# Patient Record
Sex: Female | Born: 1980
Health system: Southern US, Community
[De-identification: ages and names within clinical notes are randomized; demographics above are authoritative.]

## PROBLEM LIST (undated history)

## (undated) ENCOUNTER — Inpatient Hospital Stay (HOSPITAL_COMMUNITY): Payer: Self-pay

## (undated) DIAGNOSIS — F32A Depression, unspecified: Secondary | ICD-10-CM

## (undated) DIAGNOSIS — B999 Unspecified infectious disease: Secondary | ICD-10-CM

## (undated) DIAGNOSIS — R51 Headache: Secondary | ICD-10-CM

## (undated) DIAGNOSIS — B009 Herpesviral infection, unspecified: Secondary | ICD-10-CM

## (undated) DIAGNOSIS — F419 Anxiety disorder, unspecified: Secondary | ICD-10-CM

## (undated) DIAGNOSIS — S41119A Laceration without foreign body of unspecified upper arm, initial encounter: Secondary | ICD-10-CM

## (undated) DIAGNOSIS — IMO0002 Reserved for concepts with insufficient information to code with codable children: Secondary | ICD-10-CM

## (undated) DIAGNOSIS — N879 Dysplasia of cervix uteri, unspecified: Secondary | ICD-10-CM

## (undated) HISTORY — DX: Dysplasia of cervix uteri, unspecified: N87.9

## (undated) HISTORY — DX: Headache: R51

## (undated) HISTORY — PX: TENDON REPAIR: SHX5111

## (undated) HISTORY — DX: Unspecified infectious disease: B99.9

## (undated) HISTORY — DX: Laceration without foreign body of unspecified upper arm, initial encounter: S41.119A

## (undated) HISTORY — DX: Anxiety disorder, unspecified: F41.9

## (undated) HISTORY — DX: Reserved for concepts with insufficient information to code with codable children: IMO0002

## (undated) HISTORY — DX: Herpesviral infection, unspecified: B00.9

## (undated) HISTORY — PX: TONSILLECTOMY: SUR1361

---
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
## 2000-09-23 ENCOUNTER — Emergency Department (HOSPITAL_COMMUNITY): Admission: EM | Admit: 2000-09-23 | Discharge: 2000-09-23 | Payer: Self-pay | Admitting: *Deleted

## 2000-09-27 ENCOUNTER — Emergency Department (HOSPITAL_COMMUNITY): Admission: EM | Admit: 2000-09-27 | Discharge: 2000-09-27 | Payer: Self-pay | Admitting: Emergency Medicine

## 2001-01-04 ENCOUNTER — Emergency Department (HOSPITAL_COMMUNITY): Admission: EM | Admit: 2001-01-04 | Discharge: 2001-01-04 | Payer: Self-pay

## 2001-08-08 ENCOUNTER — Emergency Department (HOSPITAL_COMMUNITY): Admission: EM | Admit: 2001-08-08 | Discharge: 2001-08-08 | Payer: Self-pay | Admitting: Emergency Medicine

## 2001-09-02 ENCOUNTER — Observation Stay (HOSPITAL_COMMUNITY): Admission: EM | Admit: 2001-09-02 | Discharge: 2001-09-02 | Payer: Self-pay | Admitting: Emergency Medicine

## 2001-09-07 ENCOUNTER — Emergency Department (HOSPITAL_COMMUNITY): Admission: EM | Admit: 2001-09-07 | Discharge: 2001-09-08 | Payer: Self-pay | Admitting: Emergency Medicine

## 2002-03-24 DIAGNOSIS — S41119A Laceration without foreign body of unspecified upper arm, initial encounter: Secondary | ICD-10-CM

## 2002-03-24 DIAGNOSIS — B999 Unspecified infectious disease: Secondary | ICD-10-CM

## 2002-03-24 HISTORY — DX: Unspecified infectious disease: B99.9

## 2002-03-24 HISTORY — DX: Laceration without foreign body of unspecified upper arm, initial encounter: S41.119A

## 2002-03-24 HISTORY — PX: ARTERY REPAIR: SHX559

## 2002-12-16 ENCOUNTER — Emergency Department (HOSPITAL_COMMUNITY): Admission: EM | Admit: 2002-12-16 | Discharge: 2002-12-16 | Payer: Self-pay | Admitting: Emergency Medicine

## 2003-07-20 ENCOUNTER — Emergency Department (HOSPITAL_COMMUNITY): Admission: EM | Admit: 2003-07-20 | Discharge: 2003-07-20 | Payer: Self-pay | Admitting: Emergency Medicine

## 2003-07-27 ENCOUNTER — Emergency Department (HOSPITAL_COMMUNITY): Admission: EM | Admit: 2003-07-27 | Discharge: 2003-07-27 | Payer: Self-pay | Admitting: Family Medicine

## 2003-08-30 ENCOUNTER — Encounter: Admission: RE | Admit: 2003-08-30 | Discharge: 2003-08-30 | Payer: Self-pay | Admitting: Internal Medicine

## 2003-08-30 ENCOUNTER — Other Ambulatory Visit: Admission: RE | Admit: 2003-08-30 | Discharge: 2003-08-30 | Payer: Self-pay | Admitting: Internal Medicine

## 2004-09-07 ENCOUNTER — Emergency Department (HOSPITAL_COMMUNITY): Admission: EM | Admit: 2004-09-07 | Discharge: 2004-09-07 | Payer: Self-pay | Admitting: Emergency Medicine

## 2004-12-16 ENCOUNTER — Emergency Department (HOSPITAL_COMMUNITY): Admission: EM | Admit: 2004-12-16 | Discharge: 2004-12-16 | Payer: Self-pay | Admitting: Family Medicine

## 2005-01-30 ENCOUNTER — Other Ambulatory Visit: Admission: RE | Admit: 2005-01-30 | Discharge: 2005-01-30 | Payer: Self-pay | Admitting: Gynecology

## 2005-07-20 ENCOUNTER — Emergency Department (HOSPITAL_COMMUNITY): Admission: EM | Admit: 2005-07-20 | Discharge: 2005-07-20 | Payer: Self-pay | Admitting: Emergency Medicine

## 2005-07-27 ENCOUNTER — Inpatient Hospital Stay (HOSPITAL_COMMUNITY): Admission: AD | Admit: 2005-07-27 | Discharge: 2005-07-27 | Payer: Self-pay | Admitting: Gynecology

## 2005-08-26 ENCOUNTER — Other Ambulatory Visit: Admission: RE | Admit: 2005-08-26 | Discharge: 2005-08-26 | Payer: Self-pay | Admitting: Gynecology

## 2005-10-09 ENCOUNTER — Inpatient Hospital Stay (HOSPITAL_COMMUNITY): Admission: AD | Admit: 2005-10-09 | Discharge: 2005-10-09 | Payer: Self-pay | Admitting: Gynecology

## 2006-01-26 ENCOUNTER — Inpatient Hospital Stay: Payer: Self-pay

## 2006-02-01 ENCOUNTER — Inpatient Hospital Stay (HOSPITAL_COMMUNITY): Admission: AD | Admit: 2006-02-01 | Discharge: 2006-02-02 | Payer: Self-pay | Admitting: Gynecology

## 2006-03-10 ENCOUNTER — Inpatient Hospital Stay (HOSPITAL_COMMUNITY): Admission: AD | Admit: 2006-03-10 | Discharge: 2006-03-14 | Payer: Self-pay | Admitting: Gynecology

## 2006-03-11 ENCOUNTER — Encounter (INDEPENDENT_AMBULATORY_CARE_PROVIDER_SITE_OTHER): Payer: Self-pay | Admitting: *Deleted

## 2006-04-22 ENCOUNTER — Other Ambulatory Visit: Admission: RE | Admit: 2006-04-22 | Discharge: 2006-04-22 | Payer: Self-pay | Admitting: Gynecology

## 2007-03-25 DIAGNOSIS — F419 Anxiety disorder, unspecified: Secondary | ICD-10-CM

## 2007-03-25 HISTORY — DX: Anxiety disorder, unspecified: F41.9

## 2007-10-22 ENCOUNTER — Other Ambulatory Visit: Admission: RE | Admit: 2007-10-22 | Discharge: 2007-10-22 | Payer: Self-pay | Admitting: Gynecology

## 2008-03-24 HISTORY — PX: CERVICAL BIOPSY  W/ LOOP ELECTRODE EXCISION: SUR135

## 2008-11-03 ENCOUNTER — Other Ambulatory Visit: Admission: RE | Admit: 2008-11-03 | Discharge: 2008-11-03 | Payer: Self-pay | Admitting: Gynecology

## 2008-11-03 ENCOUNTER — Encounter: Payer: Self-pay | Admitting: Gynecology

## 2008-11-03 ENCOUNTER — Ambulatory Visit: Payer: Self-pay | Admitting: Gynecology

## 2008-12-01 ENCOUNTER — Encounter: Payer: Self-pay | Admitting: Gynecology

## 2008-12-01 ENCOUNTER — Ambulatory Visit: Payer: Self-pay | Admitting: Gynecology

## 2008-12-15 ENCOUNTER — Ambulatory Visit: Payer: Self-pay | Admitting: Gynecology

## 2009-01-08 ENCOUNTER — Ambulatory Visit: Payer: Self-pay | Admitting: Gynecology

## 2009-01-31 ENCOUNTER — Ambulatory Visit: Payer: Self-pay | Admitting: Gynecology

## 2009-03-19 ENCOUNTER — Ambulatory Visit: Payer: Self-pay | Admitting: Gynecology

## 2009-03-21 ENCOUNTER — Ambulatory Visit: Payer: Self-pay | Admitting: Gynecology

## 2009-03-24 HISTORY — PX: WISDOM TOOTH EXTRACTION: SHX21

## 2009-05-17 ENCOUNTER — Ambulatory Visit: Payer: Self-pay | Admitting: Gynecology

## 2009-06-12 ENCOUNTER — Other Ambulatory Visit: Admission: RE | Admit: 2009-06-12 | Discharge: 2009-06-12 | Payer: Self-pay | Admitting: Gynecology

## 2009-06-12 ENCOUNTER — Ambulatory Visit: Payer: Self-pay | Admitting: Gynecology

## 2009-12-14 ENCOUNTER — Ambulatory Visit: Payer: Self-pay | Admitting: Gynecology

## 2010-03-24 DIAGNOSIS — R87619 Unspecified abnormal cytological findings in specimens from cervix uteri: Secondary | ICD-10-CM

## 2010-03-24 DIAGNOSIS — IMO0002 Reserved for concepts with insufficient information to code with codable children: Secondary | ICD-10-CM

## 2010-03-24 HISTORY — DX: Reserved for concepts with insufficient information to code with codable children: IMO0002

## 2010-03-24 HISTORY — DX: Unspecified abnormal cytological findings in specimens from cervix uteri: R87.619

## 2010-03-29 ENCOUNTER — Ambulatory Visit
Admission: RE | Admit: 2010-03-29 | Discharge: 2010-03-29 | Payer: Self-pay | Source: Home / Self Care | Attending: Gynecology | Admitting: Gynecology

## 2010-04-14 ENCOUNTER — Encounter: Payer: Self-pay | Admitting: Internal Medicine

## 2010-08-09 NOTE — Discharge Summary (Signed)
Karen Davidson, YERBY                ACCOUNT NO.:  [id number]   MEDICAL RECORD NO.:  [id number]          PATIENT TYPE:  INP   LOCATION:  9117                          FACILITY:  [facility]   PHYSICIAN:  Juan H. Lily Peer, M.D.DATE OF BIRTH:  04/23/1980   DATE OF ADMISSION:  03/10/2006  DATE OF DISCHARGE:  03/14/2006                               DISCHARGE SUMMARY   HISTORY:  The patient is a 30 year old gravida 1, now para 1 who  underwent a primary lower uterine segment transverse cesarean section on  March 11, 2006 by Dr. Nadyne Coombes. Fontaine as a result of  cephalopelvic disproportion and clinical chorioamnionitis. The patient  had donated her cord blood for public cord blood banking. She delivered  a viable female infant with Apgars of 8 and 9 with a weight of 6 pounds 13  ounces, normal pelvic anatomy. On postoperative day #1, her Foley  catheter was removed. She was advanced from a clear to a regular diet.  Her hemoglobin and hematocrit were 8.9 and 26.2, respectively, with a  platelet count of 206,000. The patient's diet was gradually advanced.  She was began ambulating and was ready to be discharged home on her  third postoperative day. Her incision site was intact, and vital signs  were stable, and she was afebrile.   FINAL DIAGNOSES:  1. Term intrauterine pregnancy (delivered).  2. Cephalopelvic disproportion.  3. Clinical chorioamnionitis.   PROCEDURE PERFORMED:  Primary lower uterine segment transverse cesarean  section.   FINAL DISPOSITION AND FOLLOWUP:  The patient was discharged home on her  third postoperative day. She was up and ambulating and tolerating a  regular diet well. She was discharged home with prenatal vitamins and  iron supplementation 1 p.o. of each daily. She was also given  prescription for Motrin 800 mg to take t.i.d. p.r.n. along with a  prescription for Tylox to take 1 p.o. q.4-6h. p.r.n. Discharge  instructions were provided, and she was instructed to  follow up in the  office in 6 weeks for a postpartum visit.      Juan H. Lily Peer, M.D.  Electronically Signed     JHF/MEDQ  D:  03/14/2006  T:  03/14/2006  Job:  [id number]

## 2010-08-09 NOTE — H&P (Signed)
Karen Davidson, Karen Davidson                ACCOUNT NO.:  [id number]   MEDICAL RECORD NO.:  [id number]          PATIENT TYPE:  INP   LOCATION:  9156                          [facility]  [facility]   PHYSICIAN:  Juan H. Lily Peer, M.D.DATE OF BIRTH:  23-Oct-1980   DATE OF ADMISSION:  02/01/2006  DATE OF DISCHARGE:                                HISTORY & PHYSICAL   CHIEF COMPLAINT:  Contractions.   HISTORY:  The patient is a 30 year old gravida 1, para 0 with an estimated  date of confinement March 22, 2006.  She is currently [redacted] weeks gestation,  presented to [facility] late last night, early morning complaining of  contractions.  The patient had been seen in the office a few days prior and  earlier in the week she had been hospitalized overnight at [facility] for pre-term labor and received 2 doses of betamethasone and  discharged home on no other medications.  When she was seen in the office of  [facility] late last week she was started on terbutaline 2.5 mg  p.o. q. 4 hours and given instructions for rest, and keep her fluid intake.  They had stated that she had been 3 cm dilated but on the examination in the  office she was 1-2, 50%, minus 3 station.  On presentation to [facility], cervix was still the same 1-2, 50% but she was contracting.  She  was admitted for IV hydration and was kept on p.o. terbutaline and was  started on Pen G for GBS prophylaxis and was monitored overnight in the  event that she was to contract she would have been started on magnesium  sulfate for tocolysis.  Review of monitor strip had demonstrated that she  had some mild irritability and occasional contraction, 1 every hour and at  the time of this dictation, the patient is on her way to ultrasound for a  complete obstetrical ultrasound and cervical length measurement.   PRENATAL COURSE:  The patient was treated for trichomoniasis in the first  trimester and in the second  trimester she was treated for moniliasis.  She  had refused first trimester screening but opted to proceed with alpha-  fetoprotein testing.   PAST MEDICAL HISTORY:  Trichomoniasis first trimester, moniliasis second  trimester.  She denies any allergies.   REVIEW OF SYSTEMS:  See [employer] forms.   PHYSICAL EXAMINATION:  VITAL SIGNS:  Stable, afebrile.  GENERAL:  Well developed, well nourished female.  HEENT:  Unremarkable.  NECK:  Supple.  Trachea midline.  No carotid bruits, no thyromegaly.  LUNGS:  Clear to auscultation without rhonchi or wheezes.  HEART:  Regular rate and rhythm.  No murmurs, rubs or gallops.  BREAST EXAM:  Not done.  ABDOMEN:  Gravid uterus, vertex presentation by [employer].  Soft,  nontender.  PELVIC:  As described above.  EXTREMITIES:  DTR 1+.  Negative clonus, trace edema.   PRENATAL LABS:  O positive blood type, negative antibody screens.  VDRL was  non-reactive.  Rubella immune.  Hepatitis B surface antigen and HIV were  negative.  Alpha-fetoprotein was normal.  Declined first trimester screening  and cystic fibrosis screening.  Maternal serum alpha-fetoprotein results not  in chart.  Normal diabetes screen.   ASSESSMENT:  A 30 year old gravida 1, para 0 with history of pre-term labor,  received betamethasone last week x2 at [facility].  Had been  seen in the office last week, was started on terbutaline 2.5 mg p.o. q. 4  hours.  Presented last night complaining of contractions, irritability was  noted and some contractions.  She was admitted, IV hydration resolved her  contractions and she is on prophylaxis antibiotic.  Ultrasound today for  complete evaluation as well as cervical length measurement.  Will continue  to monitor in the hospital today before planning on discharge tomorrow and  also await the result of this ultrasound.  On admission her white blood  count was 6.6 and hemoglobin and hematocrit 10.8 and 31.9 respectively  with  normal platelet count and her urinalysis was negative and her comprehensive  metabolic panel with normal pregnancy parameters.   PLAN:  As per assessment above.      Juan H. Lily Peer, M.D.  Electronically Signed     JHF/MEDQ  D:  02/01/2006  T:  02/01/2006  Job:  [id number]

## 2010-08-09 NOTE — Op Note (Signed)
Karen Davidson, Karen Davidson                ACCOUNT NO.:  [id number]   MEDICAL RECORD NO.:  [id number]          PATIENT TYPE:  INP   LOCATION:  9163                          [facility]ACILITY:  [facility]   PHYSICIAN:  Timothy P. Fontaine, M.D.DATE OF BIRTH:  April 15, 1980   DATE OF PROCEDURE:  03/11/2006  DATE OF DISCHARGE:                               OPERATIVE REPORT   PREOPERATIVE DIAGNOSES:  1. Term pregnancy.  2. Cephalopelvic disproportion.  3. Clinical chorioamnionitis.   POSTOPERATIVE DIAGNOSES:  1. Term pregnancy.  2. Cephalopelvic disproportion.  3. Clinical chorioamnionitis.   PROCEDURE:  Primary low transverse cervical cesarean section.   SURGEON:  Timothy P. Fontaine, M.D.   ASSISTANT:  Scrub technician.   ANESTHETIC:  Epidural.   COMPLICATIONS:  None.   ESTIMATED BLOOD LOSS:  Less than 500 mL.   SPECIMENS:  1. Placenta.  2. Routine cord blood.  3. Public cord blood banking.   FINDINGS:  At 28 normal female, Apgars 8 and 9, weight 6 pounds 13  ounces.  Pelvic anatomy noted to be normal.   PROCEDURE:  The patient was taken to the operating room, had her  epidural catheter dosed, placed in left tilt supine position, received  an abdominal preparation with Betadine solution.  The Foley catheter had  been previously placed on the ward.  The patient was draped in usual  fashion.  After assuring adequate anesthesia, the abdomen sharply  entered through a Pfannenstiel's incision achieving adequate hemostasis  at all levels.  The bladder flap was sharply and bluntly developed  without difficulty.  Uterus sharply entered in the lower uterine  segment, the membranes were ruptured, the fluid noted to be clear and  the incision was bluntly extended laterally.  The infant's head was then  delivered through the incision.  Nares and mouth suctioned.  The rest of  the infant delivered.  The cord doubly clamped and cut and the infant  was handed to pediatrics in attendance.  Placenta was then  spontaneously  extruded, noted to be intact and was handed off for public cord blood  banking and routine cord blood.  The placenta was to be sent to  pathology.  The patient received antibiotic prophylaxis of  1 gram Ancef  at this time.  The uterus was then exteriorized.  The endometrial cavity  was explored to remove all placental membrane fragments.  The uterine  incision was closed in two layers using 0 Vicryl suture first in a  running interlocking stitch, followed by an imbricating stitch.  Uterus  was then returned to the abdomen which was copiously irrigated.  Adequate hemostasis was visualized and the anterior fascia was then  reapproximated using 0 Vicryl suture in a running stitch.  The  subcutaneous tissues were irrigated and adequate hemostasis achieved  with electrocautery.  Skin reapproximated using 4-0 Vicryl in a running  subcuticular stitch.  Benzoin and Steri-Strips were applied.  A pressure  dressing applied.  The patient was taken to the recovery room in good  condition having tolerated her procedure well.      Timothy P. Fontaine, M.D.  Electronically Signed     TPF/MEDQ  D:  03/11/2006  T:  03/11/2006  Job:  [id number]

## 2010-08-09 NOTE — Op Note (Signed)
[facility]  Patient:    Karen Davidson, Karen Davidson Visit Number: [id number] MRN: [id number]          Service Type: MED Location: 1800 [id number] Attending Physician:  Ronne Binning Dictated by:   Nicki Reaper, M.D. Proc. Date: 09/02/01 Admit Date:  09/02/2001 Discharge Date: 09/02/2001                             Operative Report  PREOPERATIVE DIAGNOSIS:  Laceration right forearm.  POSTOPERATIVE DIAGNOSIS:  Laceration right forearm.  OPERATION:  Repair of flexor carpi radialis; median nerve; ulnar artery; flexor digitorum profundus, index and middle finger; flexor digitorum superficialis, index, middle, ring, and little; flexor pollicis longus; flexor carpi radialis.  SURGEON:  Nicki Reaper, M.D.  ASSISTANT:  Joaquin Courts, R.N.  ANESTHESIA:  General.  DATE OF OPERATION:  September 02, 2001  ANESTHESIOLOGIST:  Janetta Hora. Gelene Mink, M.D.  HISTORY:  The patient is a 30 year old female who put her hand through a window suffering a laceration through the volar aspect of her forearm.  She complains of loss of sensation in the median nerve distribution.  DESCRIPTION OF PROCEDURE:  The patient was brought to the operating room where a general anesthetic was carried out without difficulty.  She was prepped and draped using Betadine scrub and solution with the right arm free.  The limb was exsanguinated with an Esmarch bandage.  A tourniquet placed high on the arm was inflated to 350 mmHg.  The wound was opened, explored.  The laceration to the ulnar artery was identified; to the median nerve was identified.  The laceration to the flexor carpi radialis; the flexor digitorum superficialis of index, middle, ring, and little; and flexor pollicis longus and the flexor profundus to the index and middle were each identified.  Repairs were performed to the tendons using modified Kesslers using 3-0 Ethibond sutures, repairing each tendon separately.  The finger  flexors and FPL were repaired, the FCR was not.  The ulnar artery was completely transected.  The ulnar nerve was intact.  The operative microscope was brought into position.  The ends of the artery were prepared, irrigated, dilated.  Repair was performed with the back wall first technique with interrupted 9-0 nylon sutures.  The median nerve was attended to next using the operative microscope.  Fascicles were aligned and a stay suture was placed with 8-0 nylon to maintain a tension-free repair.  The remainder of the repair was done aligning fascicles with an epineural repair with interrupted 9-0 nylon sutures.  The flexor carpi radialis was then repaired using modified Kessler and 3-0 Ethibond.  The palmaris longus was also repaired.  The wounds were irrigated.  The skin was stapled with staples.  A sterile compressive dressing and splint applied to fingers and thumb.  The patient tolerated the procedure well.  With deflation of the tourniquet, the fingers immediately pinked.  She was taken to the recovery room for observation in satisfactory condition.  DISPOSITION:  She is discharged home to return to [facility] [place] in one week on Vicodin and Keflex. Dictated by:   Nicki Reaper, M.D. Attending Physician:  Ronne Binning DD:  09/02/01 TD:  09/04/01 Job: [id number]

## 2010-10-31 ENCOUNTER — Ambulatory Visit: Payer: Self-pay | Admitting: Gynecology

## 2010-11-05 ENCOUNTER — Ambulatory Visit (INDEPENDENT_AMBULATORY_CARE_PROVIDER_SITE_OTHER): Payer: 59 | Admitting: Gynecology

## 2010-11-05 ENCOUNTER — Encounter: Payer: Self-pay | Admitting: Gynecology

## 2010-11-05 DIAGNOSIS — N898 Other specified noninflammatory disorders of vagina: Secondary | ICD-10-CM

## 2010-11-05 DIAGNOSIS — N879 Dysplasia of cervix uteri, unspecified: Secondary | ICD-10-CM | POA: Insufficient documentation

## 2010-11-05 DIAGNOSIS — Z124 Encounter for screening for malignant neoplasm of cervix: Secondary | ICD-10-CM

## 2010-11-05 DIAGNOSIS — B373 Candidiasis of vulva and vagina: Secondary | ICD-10-CM

## 2010-11-05 DIAGNOSIS — R350 Frequency of micturition: Secondary | ICD-10-CM

## 2010-11-05 DIAGNOSIS — N871 Moderate cervical dysplasia: Secondary | ICD-10-CM

## 2010-11-05 DIAGNOSIS — B3731 Acute candidiasis of vulva and vagina: Secondary | ICD-10-CM

## 2010-11-05 DIAGNOSIS — B009 Herpesviral infection, unspecified: Secondary | ICD-10-CM | POA: Insufficient documentation

## 2010-11-05 DIAGNOSIS — Z113 Encounter for screening for infections with a predominantly sexual mode of transmission: Secondary | ICD-10-CM

## 2010-11-05 MED ORDER — FLUCONAZOLE 150 MG PO TABS: 150.0000 mg | ORAL_TABLET | Freq: Once | ORAL | Status: AC

## 2010-11-05 NOTE — Progress Notes (Signed)
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 Followup Pap smear and ECC March 2011 was negative Pap smear January 2012 was normal. Patient also noted some vaginal itching and some urinary frequency.  Patient did ask for STD screening both GC and Chlamydia as well as serum although she has no known exposure just wanted to make sure.  Exam Pelvic: External BUS vagina White discharge KOH wet prep done. Cervix flush with upper vagina GC chlamydia Pap smear done, bimanual uterus normal size midline mobile nontender adnexa without masses or tenderness  Assessment and plan: #1 History of cervical dysplasia. CIN grade 2 positive endocervical margin on LEEP October 2010. Last 2 Pap smears were normal. If this Pap is normal will go back to annual cytology. #2 Vaginal discharge itching. Wet prep is positive for yeast we'll treat with Diflucan 150x1 dose. #3 STD screening. RPR hepatitis B hepatitis C HIV GC chlamydia screens were done patient will follow up for results.

## 2010-12-04 ENCOUNTER — Telehealth: Payer: Self-pay | Admitting: *Deleted

## 2010-12-04 NOTE — Telephone Encounter (Signed)
Pt called wanting rx for plan b pill. Left message on pt voice mail that she can pick this up at her pharmacy, she does not need a prescription.

## 2011-01-10 ENCOUNTER — Telehealth: Payer: Self-pay | Admitting: *Deleted

## 2011-01-10 DIAGNOSIS — F419 Anxiety disorder, unspecified: Secondary | ICD-10-CM

## 2011-01-10 MED ORDER — ALPRAZOLAM 0.5 MG PO TABS: 0.5000 mg | ORAL_TABLET | Freq: Every evening | ORAL | Status: AC | PRN

## 2011-01-10 NOTE — Telephone Encounter (Signed)
Pt called wanting refill on medication? Lm for pt to call back.

## 2011-01-10 NOTE — Telephone Encounter (Signed)
rx called in to pharmacy, pt informed with the below

## 2011-01-10 NOTE — Telephone Encounter (Signed)
Xanax 0.5 mg #30 one by mouth every 6 hours when necessary anxiety no refill

## 2011-01-10 NOTE — Telephone Encounter (Signed)
Pt called wanting refill on xanax 0.5 mg tablet. She tried to call pharmacy but prescription was over 46 months old and pharmacy doesn't keep dates that old. I called myself to double check and was told the same. Pt states she doesn't take the medication everyday just as needed for anxiety. Please advise

## 2011-07-21 ENCOUNTER — Other Ambulatory Visit: Payer: Self-pay | Admitting: *Deleted

## 2011-07-21 MED ORDER — VALACYCLOVIR HCL 500 MG PO TABS: 500.0000 mg | ORAL_TABLET | Freq: Every day | ORAL | Status: AC

## 2011-09-11 ENCOUNTER — Ambulatory Visit (INDEPENDENT_AMBULATORY_CARE_PROVIDER_SITE_OTHER): Payer: 59 | Admitting: Obstetrics and Gynecology

## 2011-09-11 DIAGNOSIS — Z3201 Encounter for pregnancy test, result positive: Secondary | ICD-10-CM

## 2011-09-11 DIAGNOSIS — Z331 Pregnant state, incidental: Secondary | ICD-10-CM

## 2011-09-11 LAB — POCT URINALYSIS DIPSTICK
Glucose, UA: NEGATIVE
Leukocytes, UA: NEGATIVE
Nitrite, UA: NEGATIVE
Spec Grav, UA: 1.02
Urobilinogen, UA: NEGATIVE

## 2011-09-11 MED ORDER — ONDANSETRON 4 MG PO TBDP: 4.0000 mg | ORAL_TABLET | Freq: Three times a day (TID) | ORAL | Status: AC | PRN

## 2011-09-11 MED ORDER — VALACYCLOVIR HCL 500 MG PO TABS: 500.0000 mg | ORAL_TABLET | ORAL | Status: DC

## 2011-09-11 NOTE — Progress Notes (Signed)
Pt states usually takes Valcyclovir daily for prophylaxsis. Has not taken x 1 week.  No current outbreak.   Per VL, informed is not recommended to take daily during 1st trimester, except if has outbreak.  Then take BID x 3 days.   Pt verbalizes comprehension.  Requests Rx for nausea. Discussed diet and comfort measures.

## 2011-09-12 LAB — HEPATITIS B SURFACE ANTIGEN: Hepatitis B Surface Ag: NEGATIVE

## 2011-09-12 LAB — CBC WITH DIFFERENTIAL/PLATELET
Basophils Absolute: 0 10*3/uL (ref 0.0–0.2)
Immature Grans (Abs): 0 10*3/uL (ref 0.0–0.1)
Immature Granulocytes: 0 % (ref 0–2)
Lymphs: 34 % (ref 14–46)
MCHC: 33.1 g/dL (ref 31.5–35.7)
Monocytes: 7 % (ref 4–13)
Neutrophils Relative %: 59 % (ref 40–74)
RDW: 13.1 % (ref 12.3–15.4)
WBC: 6.4 10*3/uL (ref 4.0–10.5)

## 2011-09-12 LAB — RH TYPE: Rh Factor: POSITIVE

## 2011-09-12 LAB — HEMOGLOBINOPATHY EVALUATION
Hgb A2 Quant: 2.3 % (ref 0.7–3.1)
Hgb F Quant: 1.8 % (ref 0.0–2.0)

## 2011-09-13 LAB — TEST CODE CHANGE

## 2011-09-13 LAB — HIV ANTIBODY (ROUTINE TESTING W REFLEX)
HIV 1/O/2 Abs-Index Value: <1 (ref <1.00)
HIV-1/HIV-2 Ab: NONREACTIVE

## 2011-09-22 ENCOUNTER — Encounter (HOSPITAL_COMMUNITY): Payer: Self-pay | Admitting: *Deleted

## 2011-09-22 ENCOUNTER — Inpatient Hospital Stay (HOSPITAL_COMMUNITY)
Admission: AD | Admit: 2011-09-22 | Discharge: 2011-09-22 | Disposition: A | Discharge status: Hospice | Payer: 59 | Source: Ambulatory Visit | Attending: Obstetrics and Gynecology | Admitting: Obstetrics and Gynecology

## 2011-09-22 ENCOUNTER — Inpatient Hospital Stay (HOSPITAL_COMMUNITY): Payer: 59

## 2011-09-22 ENCOUNTER — Telehealth: Payer: Self-pay | Admitting: Obstetrics and Gynecology

## 2011-09-22 DIAGNOSIS — R109 Unspecified abdominal pain: Secondary | ICD-10-CM | POA: Insufficient documentation

## 2011-09-22 DIAGNOSIS — M549 Dorsalgia, unspecified: Secondary | ICD-10-CM | POA: Insufficient documentation

## 2011-09-22 DIAGNOSIS — O200 Threatened abortion: Secondary | ICD-10-CM | POA: Insufficient documentation

## 2011-09-22 LAB — URINALYSIS, ROUTINE W REFLEX MICROSCOPIC
Bilirubin Urine: NEGATIVE
Glucose, UA: NEGATIVE mg/dL
Hgb urine dipstick: NEGATIVE
Ketones, ur: NEGATIVE mg/dL
pH: 7 (ref 5.0–8.0)

## 2011-09-22 LAB — WET PREP, GENITAL
Clue Cells Wet Prep HPF POC: NONE SEEN
Trich, Wet Prep: NONE SEEN
Yeast Wet Prep HPF POC: NONE SEEN

## 2011-09-22 MED ORDER — IBUPROFEN 600 MG PO TABS: 600.0000 mg | ORAL_TABLET | Freq: Four times a day (QID) | ORAL | Status: AC | PRN

## 2011-09-22 NOTE — MAU Note (Signed)
Pt c/o low back pain with low abd cramping, she called office and they told her to take tylenol and drink water.  She said," it did not work so I took matters into my own hands and came here".
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
## 2011-09-22 NOTE — Telephone Encounter (Signed)
TC from pt.   States since 9 am is having abd pain and cramping. No bleedingNo UTI sx. .  No recent IC. Has had 1 cup of coffee and no water today.  Suggested Tylenol and increased water but pt states is concerned.   Consult with DD, CNM. Also advises same instructions.  To call if no improvement. Pt verbalizes comprehension.

## 2011-09-22 NOTE — MAU Note (Signed)
Patient states she has been having lower back pain and low abdominal pain since this am. Denies any bleeding or discharge.

## 2011-09-22 NOTE — Discharge Instructions (Signed)
Threatened Miscarriage  Bleeding during the first 20 weeks of pregnancy is common. This is sometimes called a threatened miscarriage. This is a pregnancy that is threatening to end before the twentieth week of pregnancy. Often this bleeding stops with bed rest or decreased activities as suggested by your caregiver and the pregnancy continues without any more problems. You may be asked to not have sexual intercourse, have orgasms or use tampons until further notice. Sometimes a threatened miscarriage can progress to a complete or incomplete miscarriage. This may or may not require further treatment. Some miscarriages occur before a woman misses a menstrual period and knows she is pregnant.  Miscarriages occur in 15 to 20% of all pregnancies and usually occur during the first 13 weeks of the pregnancy. The exact cause of a miscarriage is usually never known. A miscarriage is natures way of ending a pregnancy that is abnormal or would not make it to term. There are some things that may put you at risk to have a miscarriage, such as:   Hormone problems.   Infection of the uterus or cervix.   Chronic illness, diabetes for example, especially if it is not controlled.   Abnormal shaped uterus.   Fibroids in the uterus.   Incompetent cervix (the cervix is too weak to hold the baby).   Smoking.   Drinking too much alcohol. It's best not to drink any alcohol when you are pregnant.   Taking illegal drugs.  TREATMENT   When a miscarriage becomes complete and all products of conception (all the tissue in the uterus) have been passed, often no treatment is needed. If you think you passed tissue, save it in a container and take it to your doctor for evaluation. If the miscarriage is incomplete (parts of the fetus or placenta remain in the uterus), further treatment may be needed. The most common reason for further treatment is continued bleeding (hemorrhage) because pregnancy tissue did not pass out of the uterus. This  often occurs if a miscarriage is incomplete. Tissue left behind may also become infected. Treatment usually is dilatation and curettage (the removal of the remaining products of pregnancy. This can be done by a simple sucking procedure (suction curettage) or a simple scraping of the inside of the uterus. This may be done in the hospital or in the caregiver's office. This is only done when your caregiver knows that there is no chance for the pregnancy to proceed to term. This is determined by physical examination, negative pregnancy test, falling pregnancy hormone count and/or, an ultrasound revealing a dead fetus.  Miscarriages are often a very emotional time for prospective mothers and fathers. This is not you or your partners fault. It did not occur because of an inadequacy in you or your partner. Nearly all miscarriages occur because the pregnancy has started off wrongly. At least half of these pregnancies have a chromosomal abnormality. It is almost always not inherited. Others may have developmental problems with the fetus or placenta. This does not always show up even when the products miscarried are studied under the microscope. The miscarriage is nearly always not your fault and it is not likely that you could have prevented it from happening. If you are having emotional and grieving problems, talk to your health care provider and even seek counseling, if necessary, before getting pregnant again. You can begin trying for another pregnancy as soon as your caregiver says it is OK.  HOME CARE INSTRUCTIONS    Your caregiver may order   bed rest depending on how much bleeding and cramping you are having. You may be limited to only getting up to go to the bathroom. You may be allowed to continue light activity. You may need to make arrangements for the care of your other children and for any other responsibilities.   Keep track of the number of pads you use each day, how often you have to change pads and how  saturated (soaked) they are. Record this information.   DO NOT USE TAMPONS. Do not douche, have sexual intercourse or orgasms until approved by your caregiver.   You may receive a follow up appointment for re-evaluation of your pregnancy and a repeat blood test. Re-evaluation often occurs after 2 days and again in 4 to 6 weeks. It is very important that you follow-up in the recommended time period.   If you are Rh negative and the father is Rh positive or you do not know the fathers' blood type, you may receive a shot (Rh immune globulin) to help prevent abnormal antibodies that can develop and affect the baby in any future pregnancies.  SEEK IMMEDIATE MEDICAL CARE IF:   You have severe cramps in your stomach, back, or abdomen.   You have a sudden onset of severe pain in the lower part of your abdomen.   You develop chills.   You run an unexplained temperature of 101 F (38.3 C) or higher.   You pass large clots or tissue. Save any tissue for your caregiver to inspect.   Your bleeding increases or you become light-headed, weak, or have fainting episodes.   You have a gush of fluid from your vagina.   You pass out. This could mean you have a tubal (ectopic) pregnancy.  Document Released: 03/10/2005 Document Revised: 02/27/2011 Document Reviewed: 10/25/2007  [employer] Patient Information 2012 [employer].

## 2011-09-22 NOTE — Telephone Encounter (Signed)
Triage/epic 

## 2011-09-23 ENCOUNTER — Telehealth: Payer: Self-pay | Admitting: Obstetrics and Gynecology

## 2011-09-23 LAB — GC/CHLAMYDIA PROBE AMP, GENITAL: Chlamydia, DNA Probe: NEGATIVE

## 2011-09-23 NOTE — Telephone Encounter (Signed)
TC from pt. States was seen at [facility] 09/22/11 and was told to f/u at office.  Dx: threatened AB.  Per MAU note to be seen in 1 week.   Scheduled with DD 09/29/11.  States not bleeding or pain.   Bleeding precautions given.  To call with any concerns. Pt verbalizes comprehension.

## 2011-09-29 ENCOUNTER — Encounter: Payer: 59 | Admitting: Obstetrics and Gynecology

## 2011-09-30 ENCOUNTER — Ambulatory Visit (INDEPENDENT_AMBULATORY_CARE_PROVIDER_SITE_OTHER): Payer: 59 | Admitting: Obstetrics and Gynecology

## 2011-09-30 ENCOUNTER — Encounter: Payer: Self-pay | Admitting: Obstetrics and Gynecology

## 2011-09-30 ENCOUNTER — Ambulatory Visit (INDEPENDENT_AMBULATORY_CARE_PROVIDER_SITE_OTHER): Payer: 59

## 2011-09-30 VITALS — BP 100/72 | Resp 14 | Wt 138.0 lb

## 2011-09-30 DIAGNOSIS — Z362 Encounter for other antenatal screening follow-up: Secondary | ICD-10-CM

## 2011-09-30 DIAGNOSIS — O0289 Other abnormal products of conception: Secondary | ICD-10-CM

## 2011-09-30 DIAGNOSIS — Z1389 Encounter for screening for other disorder: Secondary | ICD-10-CM

## 2011-09-30 DIAGNOSIS — R229 Localized swelling, mass and lump, unspecified: Secondary | ICD-10-CM

## 2011-09-30 DIAGNOSIS — O039 Complete or unspecified spontaneous abortion without complication: Secondary | ICD-10-CM

## 2011-09-30 DIAGNOSIS — Z3490 Encounter for supervision of normal pregnancy, unspecified, unspecified trimester: Secondary | ICD-10-CM

## 2011-09-30 DIAGNOSIS — O020 Blighted ovum and nonhydatidiform mole: Secondary | ICD-10-CM

## 2011-09-30 DIAGNOSIS — Z331 Pregnant state, incidental: Secondary | ICD-10-CM

## 2011-09-30 DIAGNOSIS — O209 Hemorrhage in early pregnancy, unspecified: Secondary | ICD-10-CM

## 2011-09-30 DIAGNOSIS — R2230 Localized swelling, mass and lump, unspecified upper limb: Secondary | ICD-10-CM | POA: Insufficient documentation

## 2011-09-30 LAB — US OB TRANSVAGINAL

## 2011-09-30 MED ORDER — MISOPROSTOL 25 MCG QUARTER TABLET: 400.0000 ug | ORAL_TABLET | ORAL | Status: DC

## 2011-09-30 NOTE — Progress Notes (Signed)
Follow-up threatened AB on 09/22/2011 per MAU. Pt has no complaints. No bleeding or cramping today. F/U USS to confirm if blighted ovum. Blighted ovum confirmed on USS (09/30/11) [redacted]w[redacted]d Irregular gestational sac [redacted]w[redacted]d, no embryo (yolk sac is visualized) Normal ovaries, no fluid in CDS, normal adenexas  Impression: Blighted ovum. Discussed all options of treatment with patient. Decided to follow with Cytotec Tx.  Plan: Baseline Quant (09/30/11) - done Cytotec PV tonight when the patient gets home Cytotec PV 4 hours after first dose. (Medicine to patient at office)  If bleeding has not occurred in 24hrs to contact provider. To have f/u Quant (10/03/11)

## 2011-10-03 ENCOUNTER — Other Ambulatory Visit: Payer: 59

## 2011-10-03 DIAGNOSIS — O030 Genital tract and pelvic infection following incomplete spontaneous abortion: Secondary | ICD-10-CM

## 2011-10-10 ENCOUNTER — Encounter: Payer: 59 | Admitting: Obstetrics and Gynecology

## 2011-12-27 ENCOUNTER — Ambulatory Visit: Payer: 59

## 2011-12-27 ENCOUNTER — Emergency Department (INDEPENDENT_AMBULATORY_CARE_PROVIDER_SITE_OTHER)
Admission: EM | Admit: 2011-12-27 | Discharge: 2011-12-27 | Disposition: A | Discharge status: Home / Self Care | Payer: 59 | Source: Home / Self Care | Attending: Emergency Medicine | Admitting: Emergency Medicine

## 2011-12-27 ENCOUNTER — Encounter (HOSPITAL_COMMUNITY): Payer: Self-pay | Admitting: Emergency Medicine

## 2011-12-27 DIAGNOSIS — K529 Noninfective gastroenteritis and colitis, unspecified: Secondary | ICD-10-CM

## 2011-12-27 DIAGNOSIS — K5289 Other specified noninfective gastroenteritis and colitis: Secondary | ICD-10-CM

## 2011-12-27 LAB — CBC WITH DIFFERENTIAL/PLATELET
Basophils Absolute: 0 10*3/uL (ref 0.0–0.1)
Eosinophils Relative: 1 % (ref 0–5)
HCT: 36.8 % (ref 36.0–46.0)
Lymphocytes Relative: 56 % — ABNORMAL HIGH (ref 12–46)
Lymphs Abs: 2.9 10*3/uL (ref 0.7–4.0)
MCV: 92.2 fL (ref 78.0–100.0)
Monocytes Absolute: 0.4 10*3/uL (ref 0.1–1.0)
Neutro Abs: 1.9 10*3/uL (ref 1.7–7.7)
RBC: 3.99 MIL/uL (ref 3.87–5.11)
WBC: 5.2 10*3/uL (ref 4.0–10.5)

## 2011-12-27 LAB — POCT I-STAT, CHEM 8
BUN: 10 mg/dL (ref 6–23)
Chloride: 102 mEq/L (ref 96–112)
Creatinine, Ser: 0.9 mg/dL (ref 0.50–1.10)
Sodium: 139 mEq/L (ref 135–145)
TCO2: 26 mmol/L (ref 0–100)

## 2011-12-27 LAB — POCT URINALYSIS DIP (DEVICE)
Bilirubin Urine: NEGATIVE
Ketones, ur: NEGATIVE mg/dL
Leukocytes, UA: NEGATIVE
Protein, ur: NEGATIVE mg/dL
Specific Gravity, Urine: 1.02 (ref 1.005–1.030)

## 2011-12-27 LAB — LIPASE, BLOOD: Lipase: 30 U/L (ref 11–59)

## 2011-12-27 LAB — POCT PREGNANCY, URINE: Preg Test, Ur: NEGATIVE

## 2011-12-27 MED ORDER — ONDANSETRON 4 MG PO TBDP: 4.0000 mg | ORAL_TABLET | Freq: Once | ORAL | Status: DC

## 2011-12-27 MED ORDER — DIPHENOXYLATE-ATROPINE 2.5-0.025 MG PO TABS: 1.0000 | ORAL_TABLET | Freq: Four times a day (QID) | ORAL | Status: DC | PRN

## 2011-12-27 MED ORDER — ONDANSETRON 4 MG PO TBDP
ORAL_TABLET | ORAL | Status: AC
  Filled 2011-12-27: qty 1

## 2011-12-27 MED ORDER — ONDANSETRON 4 MG PO TBDP
4.0000 mg | ORAL_TABLET | Freq: Once | ORAL | Status: AC
  Administered 2011-12-27: 4 mg via ORAL

## 2011-12-27 NOTE — ED Notes (Signed)
Pt c/o poss food poisoning... States she stopped at a gas station and bought a muffin... Did not realized it had mold on it... Took 2 bites and digested it before realizing... Sx include: vomiting, nauseas, fevers, diarrhea, abd pain.

## 2011-12-27 NOTE — ED Provider Notes (Signed)
History     CSN: [id number]  Arrival date & time 12/27/11  1407   First MD Initiated Contact with Patient 12/27/11 1445      Chief Complaint  Patient presents with  . Emesis    (Consider location/radiation/quality/duration/timing/severity/associated sxs/prior treatment) HPI Comments: Patient presents urgent care this afternoon complaining that she's been experiencing episodes of vomiting and liquidy diarrhea since Wednesday. Has experienced some chills and tactile fevers had recorded a temperature of 100. She suspects that Tuesday she stopped at a gas station and.and 8 a muffin she  later realized that it had mold on/in  it. She took about 2 bites she did not expressed any symptoms that day but the following morning started having vomiting nausea diarrhea is and cramping abdominal pains. She denies any blood or mucus material in her diarrhea. Probably she had contacted the monitoring company.  Patient describes the pain comes and goes and cramping in character and points to her epigastric area.  Patient is a 31 y.o. female presenting with vomiting. The history is provided by the patient.  Emesis  This is a new problem. The current episode started more than 2 days ago. The problem occurs 5 to 10 times per day. The problem has not changed since onset.The emesis has an appearance of stomach contents. The maximum temperature recorded prior to her arrival was 100 to 100.9 F. Associated symptoms include abdominal pain, chills, diarrhea and a fever. Pertinent negatives include no arthralgias, no cough, no myalgias, no sweats and no URI. Risk factors include suspect food intake.    Past Medical History  Diagnosis Date  . HSV-2 infection   . Cervical dysplasia     Normal pap 05/2009,03/2010  . Anxiety 2009    SHORT TERM MEDS  . Abnormal Pap smear 2012    LEEP; LAST PAP 2013  . Preterm labor 2007  . Headache     FREQUENT;  3X/MONTH  . Infection     FREQUENT UTI DURING PREGNANCY  .  Infection 2004    HSV2  . Laceration of arm 2004    SURGICAL REPAIR    Past Surgical History  Procedure Date  . Artery repair 2004    TENDON/ARTERY REPAIR RIGHT ARM  . Cervical biopsy  w/ loop electrode excision 2010    CIN II with positive endocervical margins  . Cesarean section 02/2006  . Wisdom tooth extraction 2011  . Tonsillectomy AGE 37  . Tendon repair     Family History  Problem Relation Age of Onset  . Asthma Mother   . Liver disease Mother     HEPATITIS C  . Mental illness Mother     BIPOLAR; SCHIZOPHRENIA  . Diabetes Mother   . Alcohol abuse Mother   . Drug abuse Mother   . Early death Mother 109    CAR ACCIDENT  . Drug abuse Father   . Alcohol abuse Father   . Asthma Brother   . Asthma Son   . Heart disease Son     MURMUR  . Mental illness Maternal Aunt     BIPOLAR  . Diabetes Maternal Aunt   . Arthritis Maternal Grandmother   . Diabetes Maternal Grandmother   . Hyperlipidemia Maternal Grandmother   . Hypertension Maternal Grandmother   . Kidney disease Maternal Grandmother   . Other Maternal Grandmother     VARICOSE VEINS  . Mental illness Maternal Grandfather     BIPOLAR  . Cancer Paternal Grandfather     PROSTATE  .  Asthma Brother   . Mental illness Brother     BIPOLAR    History  Substance Use Topics  . Smoking status: Never Smoker   . Smokeless tobacco: Never Used  . Alcohol Use: Yes     Rarely;  1 x month wine prior to pregnancy    OB History    Grav Para Term Preterm Abortions TAB SAB Ect Mult Living   3 1 1  1  1   1      Obstetric Comments   2007 PTL STARTING 12/2005;   BEDREST;  ON TERB      Review of Systems  Constitutional: Positive for fever, chills and activity change.  Respiratory: Negative for cough and shortness of breath.   Gastrointestinal: Positive for nausea, vomiting, abdominal pain and diarrhea. Negative for blood in stool, anal bleeding and rectal pain.  Genitourinary: Negative for dysuria.    Musculoskeletal: Negative for myalgias and arthralgias.  Skin: Negative for rash.  Neurological: Negative for dizziness.    Allergies  Review of patient's allergies indicates no known allergies.  Home Medications   Current Outpatient Rx  Name Route Sig Dispense Refill  . DIPHENOXYLATE-ATROPINE 2.5-0.025 MG PO TABS Oral Take 1 tablet by mouth 4 (four) times daily as needed for diarrhea or loose stools. 15 tablet 0  . DOCUSATE SODIUM 100 MG PO CAPS Oral Take 100 mg by mouth 2 (two) times daily as needed. For constipation    . ONDANSETRON 4 MG PO TBDP Oral Take 1 tablet (4 mg total) by mouth once. 20 tablet 0  . PRENATAL VITAMIN PO Oral Take 1 tablet by mouth. OTC PNV WITH DHA    . VALACYCLOVIR HCL 500 MG PO TABS Oral Take 1 tablet (500 mg total) by mouth 1 day or 1 dose. 30 tablet 6    BP 122/79  Pulse 78  Temp 97.2 F (36.2 C) (Oral)  Resp 16  SpO2 99%  LMP 12/20/2011  Breastfeeding? Unknown  Physical Exam  Nursing note and vitals reviewed. Constitutional: She appears well-developed and well-nourished.  HENT:  Head: Normocephalic.  Mouth/Throat: No oropharyngeal exudate.  Eyes: Conjunctivae normal are normal. No scleral icterus.  Neck: Neck supple.  Pulmonary/Chest: Effort normal and breath sounds normal. No respiratory distress.  Abdominal: Soft. She exhibits no pulsatile liver, no fluid wave and no mass. There is no hepatosplenomegaly, splenomegaly or hepatomegaly. There is tenderness in the epigastric area and periumbilical area. There is no rigidity, no rebound, no guarding, no CVA tenderness and negative Konecny's sign. No hernia. Hernia confirmed negative in the ventral area.  Skin: No rash noted. No erythema.    ED Course  Procedures (including critical care time)  Labs Reviewed  CBC WITH DIFFERENTIAL - Abnormal; Notable for the following:    Neutrophils Relative 36 (*)     Lymphocytes Relative 56 (*)     All other components within normal limits  POCT  URINALYSIS DIP (DEVICE) - Abnormal; Notable for the following:    Hgb urine dipstick TRACE (*)     All other components within normal limits  POCT I-STAT, CHEM 8 - Abnormal; Notable for the following:    Calcium, Ion 1.25 (*)     All other components within normal limits  LIPASE, BLOOD  POCT PREGNANCY, URINE   No results found.   1. Gastroenteritis       MDM   gastroenteritis patient tolerating oral fluids well with a soft abdomen. Afebrile with a discrete lymphocytes. Patient was prescribed Zofran  and lomotil        Jimmie Molly, MD 12/27/11 1719

## 2012-02-02 ENCOUNTER — Telehealth: Payer: Self-pay | Admitting: Obstetrics and Gynecology

## 2012-02-02 NOTE — Telephone Encounter (Signed)
Lm on vm tcb rgd msg 

## 2012-02-02 NOTE — Telephone Encounter (Signed)
Spoke with pt rgd msg pt early preg wants eval due to recent miscarriage in July pt has appt 02/03/12 at 4:00 with SL cnm pt voice understanding

## 2012-02-03 ENCOUNTER — Encounter: Payer: Self-pay | Admitting: Obstetrics and Gynecology

## 2012-02-03 ENCOUNTER — Ambulatory Visit (INDEPENDENT_AMBULATORY_CARE_PROVIDER_SITE_OTHER): Payer: 59 | Admitting: Obstetrics and Gynecology

## 2012-02-03 VITALS — BP 100/70 | Ht 64.5 in | Wt 143.0 lb

## 2012-02-03 DIAGNOSIS — Z8759 Personal history of other complications of pregnancy, childbirth and the puerperium: Secondary | ICD-10-CM

## 2012-02-03 DIAGNOSIS — Z331 Pregnant state, incidental: Secondary | ICD-10-CM

## 2012-02-03 DIAGNOSIS — Z8742 Personal history of other diseases of the female genital tract: Secondary | ICD-10-CM

## 2012-02-03 LAB — POCT URINE PREGNANCY: Preg Test, Ur: POSITIVE

## 2012-02-03 LAB — POCT WET PREP (WET MOUNT)
Bacteria Wet Prep HPF POC: NEGATIVE
Trichomonas Wet Prep HPF POC: NEGATIVE
pH: 4

## 2012-02-03 LAB — HCG, QUANTITATIVE, PREGNANCY: hCG, Beta Chain, Quant, S: 31563 m[IU]/mL

## 2012-02-03 NOTE — Progress Notes (Signed)
Pt here for visit. Pt states her  LMP was 12/20/2011.  Pt needs NOB Interview and wants U/S

## 2012-02-03 NOTE — Progress Notes (Signed)
S: Pt has hx of blighted ovum at 9wks in July w use of cytotec. She reports feeling very upset by this and that having to pass POC at home was very difficult. LMP 12/20/11  She denies any pain.  She states she had 1 episode of scant pink spotting Saturday after having IC, no bleeding since.  She reports having more frequent HA's, denies N/V.  No other c/o.   O: UPT pos Abdomen soft non-tender Pelvic: EGBUS WNL, cervix appears normal, scant dark brown discharge at os  Bimanual WNL, uterus approx 6wk size  A: early pregnancy w hx of recent blighted ovum  Has had one FTD in 2007  P: wet prep - WNL GC/CT collected Pelvic rest  Will check quant and plan [place] if >1600

## 2012-02-04 ENCOUNTER — Telehealth: Payer: Self-pay | Admitting: Obstetrics and Gynecology

## 2012-02-04 LAB — GC/CHLAMYDIA PROBE AMP
CT Probe RNA: NEGATIVE
GC Probe RNA: NEGATIVE

## 2012-02-04 NOTE — Telephone Encounter (Signed)
Tc from pt. Informed pt of QHCG=31,563. Pt needs viability u/s an NOB workup per SL. Pt offerd appt on 11/15/ for u/s. Pt will discuss with workplace and cb if able to sched appt. Pt agrees.

## 2012-02-04 NOTE — Telephone Encounter (Signed)
Tc to pt per SL recs. Lm on vm to cb.

## 2012-02-04 NOTE — Telephone Encounter (Signed)
Message copied by Raylene Everts on Wed Feb 04, 2012 10:20 AM ------      Message from: Malissa Hippo.      Created: Wed Feb 04, 2012 10:16 AM      Regarding: call pt ASAP w quant results and sched viability [place]        Pt is approx 6wks by LMP, hx of blighted ovum in July.       She needs a viability [place], then a NOB w/u. (She already had interview in July)            Can someone please call me w results of [place]?       [phone]             Thanks!      SL

## 2012-02-05 ENCOUNTER — Other Ambulatory Visit: Payer: Self-pay

## 2012-02-05 DIAGNOSIS — O09299 Supervision of pregnancy with other poor reproductive or obstetric history, unspecified trimester: Secondary | ICD-10-CM

## 2012-02-06 ENCOUNTER — Ambulatory Visit (INDEPENDENT_AMBULATORY_CARE_PROVIDER_SITE_OTHER): Payer: 59

## 2012-02-06 ENCOUNTER — Ambulatory Visit (INDEPENDENT_AMBULATORY_CARE_PROVIDER_SITE_OTHER): Payer: 59 | Admitting: Obstetrics and Gynecology

## 2012-02-06 ENCOUNTER — Encounter: Payer: Self-pay | Admitting: Obstetrics and Gynecology

## 2012-02-06 ENCOUNTER — Other Ambulatory Visit: Payer: Self-pay | Admitting: Obstetrics and Gynecology

## 2012-02-06 VITALS — BP 110/66 | Ht 64.5 in | Wt 142.0 lb

## 2012-02-06 DIAGNOSIS — O3680X0 Pregnancy with inconclusive fetal viability, not applicable or unspecified: Secondary | ICD-10-CM

## 2012-02-06 DIAGNOSIS — O09299 Supervision of pregnancy with other poor reproductive or obstetric history, unspecified trimester: Secondary | ICD-10-CM

## 2012-02-06 LAB — US OB TRANSVAGINAL

## 2012-02-06 LAB — US OB COMP LESS 14 WKS

## 2012-02-06 NOTE — Progress Notes (Signed)
Subjective:    Karen Davidson is a 31 y.o. female, [phone], who presents for Gyn ultrasound because of viability.  The following portions of the patient's history were reviewed and updated as appropriate: allergies, current medications, past family history.  Objective:    BP 110/66  Ht 5' 4.5" (1.638 m)  Wt 142 lb (64.411 kg)  BMI 24.00 kg/m2  LMP 12/20/2011  Breastfeeding? No    Weight:  Wt Readings from Last 1 Encounters:  02/06/12 142 lb (64.411 kg)          BMI: Body mass index is 24.00 kg/(m^2).  ULTRASOUND: Uterus     Adnexa normal    Endometrium n/a     Free fluid: no    Other findings:  [redacted]w[redacted]d IUP, yolk sac is seen, normal ovaries, normal cardiac rythm FHT = 124   Assessment:    viable IUP    Plan:    NOB work-up  Silverio Lay MD

## 2012-03-08 ENCOUNTER — Encounter: Payer: Self-pay | Admitting: Obstetrics and Gynecology

## 2012-03-08 ENCOUNTER — Ambulatory Visit (INDEPENDENT_AMBULATORY_CARE_PROVIDER_SITE_OTHER): Payer: 59 | Admitting: Obstetrics and Gynecology

## 2012-03-08 VITALS — BP 104/62 | Wt 139.0 lb

## 2012-03-08 DIAGNOSIS — Z98891 History of uterine scar from previous surgery: Secondary | ICD-10-CM | POA: Insufficient documentation

## 2012-03-08 DIAGNOSIS — IMO0002 Reserved for concepts with insufficient information to code with codable children: Secondary | ICD-10-CM

## 2012-03-08 DIAGNOSIS — Z9889 Other specified postprocedural states: Secondary | ICD-10-CM

## 2012-03-08 DIAGNOSIS — Z331 Pregnant state, incidental: Secondary | ICD-10-CM

## 2012-03-08 DIAGNOSIS — F411 Generalized anxiety disorder: Secondary | ICD-10-CM

## 2012-03-08 DIAGNOSIS — F419 Anxiety disorder, unspecified: Secondary | ICD-10-CM

## 2012-03-08 LAB — POCT WET PREP (WET MOUNT)
Whiff Test: NEGATIVE
pH: 4.5

## 2012-03-08 LAB — OB RESULTS CONSOLE RPR: RPR: NONREACTIVE

## 2012-03-08 NOTE — Progress Notes (Addendum)
[facility] NEW OB EXAMINATION   Karen Davidson is a 31 y.o. female, [id number], who presents at [redacted]w[redacted]d gestation for a new obstetrical examination. The patient was evaluated for viability on February 06, 2012.  An ultrasound showed a 6 week and 6 day gestation. She said that her last period was normal. She had a miscarriage in June of 2013.  She has had a prior cesarean delivery.  She has a past history of herpes virus.  She has had CIN- 2. She has had a LEEP in the past.  The following portions of the patient's history were reviewed and updated as appropriate: allergies, current medications, past family history, past medical history, past social history, past surgical history and problem list.  OB History    Grav Para Term Preterm Abortions TAB SAB Ect Mult Living   4 1 1  2  2   1      Obstetric Comments   2007 PTL STARTING 12/2005;   BEDREST;  ON TERB      Past Medical History  Diagnosis Date  . HSV-2 infection   . Cervical dysplasia     Normal pap 05/2009,03/2010  . Anxiety 2009    SHORT TERM MEDS  . Abnormal Pap smear 2012    LEEP; LAST PAP 2013  . Preterm labor 2007  . Headache     FREQUENT;  3X/MONTH  . Infection     FREQUENT UTI DURING PREGNANCY  . Infection 2004    HSV2  . Laceration of arm 2004    SURGICAL REPAIR    Past Surgical History  Procedure Date  . Artery repair 2004    TENDON/ARTERY REPAIR RIGHT ARM  . Cervical biopsy  w/ loop electrode excision 2010    CIN II with positive endocervical margins  . Cesarean section 02/2006  . Wisdom tooth extraction 2011  . Tonsillectomy AGE 44  . Tendon repair     Family History  Problem Relation Age of Onset  . Asthma Mother   . Liver disease Mother     HEPATITIS C  . Mental illness Mother     BIPOLAR; SCHIZOPHRENIA  . Diabetes Mother   . Alcohol abuse Mother   . Drug abuse Mother   . Early death Mother 71    CAR ACCIDENT  . Drug abuse Father   . Alcohol abuse Father   . Asthma Brother   . Asthma Son   .  Heart disease Son     MURMUR  . Mental illness Maternal Aunt     BIPOLAR  . Diabetes Maternal Aunt   . Arthritis Maternal Grandmother   . Diabetes Maternal Grandmother   . Hyperlipidemia Maternal Grandmother   . Hypertension Maternal Grandmother   . Kidney disease Maternal Grandmother   . Other Maternal Grandmother     VARICOSE VEINS  . Mental illness Maternal Grandfather     BIPOLAR  . Cancer Paternal Grandfather     PROSTATE  . Asthma Brother   . Mental illness Brother     BIPOLAR    Social History:  reports that she has never smoked. She has never used smokeless tobacco. She reports that she drinks alcohol. She reports that she does not use illicit drugs.  Allergies: No Known Allergies  Medications: prenatal vitamins, Valtrex   Objective:    BP 104/62  Wt 139 lb (63.05 kg)  LMP 12/20/2011  Breastfeeding? No    Weight:  Wt Readings from Last 1 Encounters:  03/08/12 139 lb (63.05  kg)          BMI: There is no height on file to calculate BMI.  General Appearance: Alert, appropriate appearance for age. No acute distress HEENT: Grossly normal Neck / Thyroid: Supple, no masses, nodes or enlargement Lungs: clear to auscultation bilaterally Back: No CVA tenderness Breast Exam: No masses or nodes.No dimpling, nipple retraction or discharge. Cardiovascular: Regular rate and rhythm. S1, S2, no murmur Gastrointestinal: Soft, non-tender, no masses or organomegaly.                               Fundal height: not palpable weeks                               Fetal heart tones audible: no  ++++++++++++++++++++++++++++++++++++++++++++++++++++++++  Pelvic Exam: External genitalia: normal general appearance Vaginal: normal without tenderness, induration or masses and relaxation: No Cervix: normal appearance Adnexa: normal bimanual exam Uterus: gravid, nontender, 12 weeks size  ++++++++++++++++++++++++++++++++++++++++++++++++++++++++  Lymphatic Exam: Non-palpable nodes in  neck, clavicular, axillary, or inguinal regions Neurologic: Normal speech, no tremor  Psychiatric: Alert and oriented, appropriate affect.  Prenatal labs: ABO, Rh: O/--/-- (06/20 1020) Antibody: Negative (06/20 1020) Rubella:  immune RPR:   nonreactive HBsAg: Negative (06/20 1020)  HIV:   nonreactive GBS:   pending until the third trimester Gonorrhea: Negative Chlamydia: Negative  Wet Prep:   Previously done:            no                     If no: Whiff:                     Negative                              Clue cells:             no                              PH:                        4.5                              Yeast:                    no                              Trichomoniasis:    no  Ultrasound: Single gestation, viable, normal ovaries, 11 weeks and 5 days  Assessment:   31 y.o. female [id number] at [redacted]w[redacted]d gestation ( EDC is September 25, 2012) disease by: Normal Last menstrual period: yes Ultrasound:                               yes                                Could not hear fetal heart tones-viable ultrasound.  Prior cesarean section  Herpes virus  History of CIN-2  Anxiety   Plan:   Urine culture next visit.  We discussed routine pregnancy issues:  Toxoplasmosis was reviewed.  The patient was told to avoid cat liter boxes and feces.  The patient was told to avoid predator fish including tuna because of our concerns for mercury consumption.  The patient was told to avoid soft cheeses.  The patient was told to be sure that all lunch meats are well cooked.  Genetic screening was discussed. First trimester screen next visit  Our model for pregnancy management was reviewed.  Proper diet and exercise reviewed.  Return to office in 2 weeks.  Medications include:  Prenatal vitamins  Mylinda Latina.D.

## 2012-03-08 NOTE — Progress Notes (Signed)
109w2d  C/O: Possible yeast infection.  Last Pap: 03/2011 "WNL"  Pt requested Genetic Testing. Pt states she is concerned about pregnancy b/c she had a miscarriage in 09/2011. Pt < than 16 weeks FHTs will be done by Dr.Stringer.

## 2012-03-18 ENCOUNTER — Ambulatory Visit (INDEPENDENT_AMBULATORY_CARE_PROVIDER_SITE_OTHER): Payer: 59

## 2012-03-18 DIAGNOSIS — Z36 Encounter for antenatal screening of mother: Secondary | ICD-10-CM

## 2012-03-18 DIAGNOSIS — IMO0002 Reserved for concepts with insufficient information to code with codable children: Secondary | ICD-10-CM

## 2012-03-18 LAB — US OB COMP LESS 14 WKS

## 2012-03-24 NOTE — L&D Delivery Note (Signed)
Delivery Note  At 2:58 PM a viable female was delivered via VBAC, Spontaneous.  Position: Right,, Occiput,, Anterior  Prior to the delivery of the head, help was called.   Delivery of the head: 09/21/2012  2:58 PM First maneuver: 09/21/2012  2:58 PM, McRoberts for one push Quickly followed by Suprapubic Pressure with next 1-2 pushes and shoulder dystocia was then resolved Total time of dystocia estimated at 30 secs  Cord was quickly double clamped and cut by FOB, baby was immediately taken to warmer by RN.    NICU team assessed baby. APGAR: 2, 8; weight 7 lb 14.3 oz (3580 g).    Placenta status: Intact, Spontaneous, Shultz.   Cord: 3 vessels with the following complications: None.  Cord pH: 7.063  Anesthesia: Epidural  Episiotomy: None Lacerations: 2nd degree Dr. Pennie Rushing called to BS to evaluated laceration and completed repair Suture Repair: 2.0 3.0 monocryl Est. Blood Loss (mL): 200  During labor pt had more than two elevated blood pressures separated by at least 4 hours and a PCR of 0.57.  C/w Dr Pennie Rushing.   Mom to AICU for 24 hour Magnesium Sulfate.    Once baby was cleared by NICU team baby was weighed and then brought to mom for skin to skin. Plans to bottle feed  OXLEY, JENNIFER 09/21/2012, 4:36 PM

## 2012-03-31 ENCOUNTER — Ambulatory Visit (INDEPENDENT_AMBULATORY_CARE_PROVIDER_SITE_OTHER): Payer: 59 | Admitting: Obstetrics and Gynecology

## 2012-03-31 VITALS — BP 102/56 | Wt 140.0 lb

## 2012-03-31 DIAGNOSIS — Z3689 Encounter for other specified antenatal screening: Secondary | ICD-10-CM

## 2012-03-31 DIAGNOSIS — Z331 Pregnant state, incidental: Secondary | ICD-10-CM

## 2012-03-31 LAB — POCT URINALYSIS DIPSTICK
Ketones, UA: NEGATIVE
Nitrite, UA: NEGATIVE
Spec Grav, UA: 1.015
Urobilinogen, UA: NEGATIVE
pH, UA: 7

## 2012-03-31 NOTE — Progress Notes (Signed)
Pt stated been having headaches and back pain . C/o of yeast infection. Pt stated no other issues today.

## 2012-03-31 NOTE — Progress Notes (Signed)
[redacted]w[redacted]d The patient complains of pain across her upper abdomen. This is likely due to gas. Fiber recommended. The patient complains of upper back pain. She has no CVA tenderness. Urinalysis is negative. Patient is interested in a vaginal birth after cesarean section. VBAC consent form given. She will sign next visit. Return to office in 4 weeks. Anatomy ultrasound next visit. Declined alpha-fetoprotein screen for today. Note written for work so that she can go to the restroom frequently. Dr. Stefano Gaul

## 2012-04-02 ENCOUNTER — Telehealth: Payer: Self-pay | Admitting: Obstetrics and Gynecology

## 2012-04-02 NOTE — Telephone Encounter (Signed)
Pt called stating that she was having some sharp lower abd cramps off and on. She was seen 2 days ago by AVS She is 14+6 weeks preg. No FM yet, no bleeding or loss of fluid. I rec that she take some Tylenol, increase water intake. Observe, and call me at approx 2 pm to report status of sx's. Her urine wasn't sent for UCX the other day, although pt had sx's. She also was not checked for yeast, which she states she complained of. I asked her if she'd be able to come by the office today to do CCUA for UCX. Pt will ask at work about the poss. Of leaving early  To be able to get here in time to do that, and let me know when she calls me at 2 . Pt is agreeable. Melody Comas A

## 2012-04-07 ENCOUNTER — Other Ambulatory Visit: Payer: Self-pay | Admitting: Obstetrics and Gynecology

## 2012-04-07 ENCOUNTER — Telehealth: Payer: Self-pay | Admitting: Obstetrics and Gynecology

## 2012-04-07 ENCOUNTER — Ambulatory Visit: Payer: 59

## 2012-04-07 DIAGNOSIS — R109 Unspecified abdominal pain: Secondary | ICD-10-CM

## 2012-04-07 DIAGNOSIS — R102 Pelvic and perineal pain: Secondary | ICD-10-CM

## 2012-04-07 LAB — POCT URINALYSIS DIPSTICK
Bilirubin, UA: NEGATIVE
Glucose, UA: NEGATIVE
Ketones, UA: NEGATIVE
Nitrite, UA: NEGATIVE

## 2012-04-07 NOTE — Telephone Encounter (Signed)
TC from pt c/o abd & pelvic pain off and on about 3-4x's a wk. Pt declines VB & LOF. Pt states no relief with Tylenol as recommended by Annice Pih CMA on 04/02/12. Lab visit for UA culture was also recommended on 04/02/12 by Annice Pih but pt didn't show. Pt declined to try Motrin & states she has had miscarriages the past with the same sx's & demands to be seen today. Informed pt spoke with SR & AR's assistants and there aren't any openings in the office today due to the new system. Pt was recommended a MAU visit; however, declined & states MAU visits are too long. Spoke with ND on call; pt may come for lab visit today for UA culture. Pt agrees and voices understanding.

## 2012-04-13 LAB — URINE CULTURE

## 2012-04-27 ENCOUNTER — Ambulatory Visit: Payer: 59 | Admitting: Obstetrics and Gynecology

## 2012-04-27 ENCOUNTER — Ambulatory Visit: Payer: 59

## 2012-04-27 VITALS — BP 100/60 | Wt 143.0 lb

## 2012-04-27 DIAGNOSIS — Z98891 History of uterine scar from previous surgery: Secondary | ICD-10-CM

## 2012-04-27 DIAGNOSIS — Z1389 Encounter for screening for other disorder: Secondary | ICD-10-CM

## 2012-04-27 DIAGNOSIS — Z3689 Encounter for other specified antenatal screening: Secondary | ICD-10-CM

## 2012-04-27 DIAGNOSIS — F419 Anxiety disorder, unspecified: Secondary | ICD-10-CM

## 2012-04-27 DIAGNOSIS — O283 Abnormal ultrasonic finding on antenatal screening of mother: Secondary | ICD-10-CM

## 2012-04-27 NOTE — Progress Notes (Signed)
[redacted]w[redacted]d No complaints today. Ultrasound shows:  SIUP  S=D     [place] EDD: 09/26/2012           EFW: 8 oz 49%           AFI: n/a           Cervical length: 3.26 cm           Placenta localization: posterior           Fetal presentation: vertex                   Anatomy survey is normal           Gender : female Comments: No previa. Placenta edge to cx = 4.1 cm Normal placental cord insertion.  AP pocket = 4.4 cm Measurements are concordant with established GA/EDD Only finding is mild fetal pyelectasis AP diameter;  RK = 2.5 mm LK WNLs at 1.5 mm Suggest f/u at 27-28 weeks for evaluation of fetal kidney  Pt wants VBAC.  CONSENT signed today

## 2012-04-29 LAB — US OB COMP + 14 WK

## 2012-05-19 ENCOUNTER — Ambulatory Visit: Payer: 59 | Admitting: Obstetrics and Gynecology

## 2012-05-19 VITALS — BP 112/58 | Wt 147.0 lb

## 2012-05-19 DIAGNOSIS — Z98891 History of uterine scar from previous surgery: Secondary | ICD-10-CM

## 2012-05-19 DIAGNOSIS — F419 Anxiety disorder, unspecified: Secondary | ICD-10-CM

## 2012-05-19 DIAGNOSIS — B009 Herpesviral infection, unspecified: Secondary | ICD-10-CM

## 2012-05-19 NOTE — Patient Instructions (Signed)
Constipation, Adult Constipation is when a person has fewer than 3 bowel movements a week; has difficulty having a bowel movement; or has stools that are dry, hard, or larger than normal. As people grow older, constipation is more common. If you try to fix constipation with medicines that make you have a bowel movement (laxatives), the problem may get worse. Long-term laxative use may cause the muscles of the colon to become weak. A low-fiber diet, not taking in enough fluids, and taking certain medicines may make constipation worse. CAUSES   Certain medicines, such as antidepressants, pain medicine, iron supplements, antacids, and water pills.   Certain diseases, such as diabetes, irritable bowel syndrome (IBS), thyroid disease, or depression.   Not drinking enough water.   Not eating enough fiber-rich foods.   Stress or travel.  Lack of physical activity or exercise.  Not going to the restroom when there is the urge to have a bowel movement.  Ignoring the urge to have a bowel movement.  Using laxatives too much. SYMPTOMS   Having fewer than 3 bowel movements a week.   Straining to have a bowel movement.   Having hard, dry, or larger than normal stools.   Feeling full or bloated.   Pain in the lower abdomen.  Not feeling relief after having a bowel movement. DIAGNOSIS  Your caregiver will take a medical history and perform a physical exam. Further testing may be done for severe constipation. Some tests may include:   A barium enema X-ray to examine your rectum, colon, and sometimes, your small intestine.  A sigmoidoscopy to examine your lower colon.  A colonoscopy to examine your entire colon. TREATMENT  Treatment will depend on the severity of your constipation and what is causing it. Some dietary treatments include drinking more fluids and eating more fiber-rich foods. Lifestyle treatments may include regular exercise. If these diet and lifestyle recommendations  do not help, your caregiver may recommend taking over-the-counter laxative medicines to help you have bowel movements. Prescription medicines may be prescribed if over-the-counter medicines do not work.  HOME CARE INSTRUCTIONS   Increase dietary fiber in your diet, such as fruits, vegetables, whole grains, and beans. Limit high-fat and processed sugars in your diet, such as [place] fries, hamburgers, cookies, candies, and soda.   A fiber supplement may be added to your diet if you cannot get enough fiber from foods.   Drink enough fluids to keep your urine clear or pale yellow.   Exercise regularly or as directed by your caregiver.   Go to the restroom when you have the urge to go. Do not hold it.  Only take medicines as directed by your caregiver. Do not take other medicines for constipation without talking to your caregiver first. SEEK IMMEDIATE MEDICAL CARE IF:   You have bright red blood in your stool.   Your constipation lasts for more than 4 days or gets worse.   You have abdominal or rectal pain.   You have thin, pencil-like stools.  You have unexplained weight loss. MAKE SURE YOU:   Understand these instructions.  Will watch your condition.  Will get help right away if you are not doing well or get worse. Document Released: 12/07/2003 Document Revised: 06/02/2011 Document Reviewed: 02/11/2011 [facility] Patient Information 2013 [place], [place].  OTC MIRALAX daily

## 2012-05-19 NOTE — Progress Notes (Signed)
Pt stated no issues today. Labs need to go to [facility]. Per pt  1 hr gluNV

## 2012-05-26 ENCOUNTER — Encounter: Payer: Self-pay | Admitting: Obstetrics and Gynecology

## 2012-06-04 ENCOUNTER — Telehealth: Payer: Self-pay | Admitting: Obstetrics and Gynecology

## 2012-06-04 NOTE — Telephone Encounter (Signed)
TC to pt. Per pt request, dental letter mailed.

## 2012-06-17 ENCOUNTER — Inpatient Hospital Stay (HOSPITAL_COMMUNITY)
Admission: AD | Admit: 2012-06-17 | Discharge: 2012-06-17 | Disposition: A | Discharge status: Home / Self Care | Payer: 59 | Source: Ambulatory Visit | Attending: Obstetrics and Gynecology | Admitting: Obstetrics and Gynecology

## 2012-06-17 ENCOUNTER — Other Ambulatory Visit: Payer: Self-pay | Admitting: Obstetrics and Gynecology

## 2012-06-17 DIAGNOSIS — O4700 False labor before 37 completed weeks of gestation, unspecified trimester: Secondary | ICD-10-CM | POA: Insufficient documentation

## 2012-06-17 MED ORDER — BETAMETHASONE SOD PHOS & ACET 6 (3-3) MG/ML IJ SUSP
12.0000 mg | INTRAMUSCULAR | Status: DC
  Administered 2012-06-17: 12 mg via INTRAMUSCULAR
  Filled 2012-06-17: qty 2

## 2012-06-17 NOTE — MAU Note (Signed)
I spoke with the pt and let her know tht we are working on getting the steroid shot-pt states she has no problems and does not need to be seen

## 2012-06-18 ENCOUNTER — Inpatient Hospital Stay (HOSPITAL_COMMUNITY)
Admission: AD | Admit: 2012-06-18 | Discharge: 2012-06-18 | Disposition: A | Discharge status: Home / Self Care | Payer: 59 | Source: Ambulatory Visit | Attending: Obstetrics and Gynecology | Admitting: Obstetrics and Gynecology

## 2012-06-18 DIAGNOSIS — O34219 Maternal care for unspecified type scar from previous cesarean delivery: Secondary | ICD-10-CM | POA: Insufficient documentation

## 2012-06-18 DIAGNOSIS — O4700 False labor before 37 completed weeks of gestation, unspecified trimester: Secondary | ICD-10-CM | POA: Diagnosis present

## 2012-06-18 DIAGNOSIS — O4702 False labor before 37 completed weeks of gestation, second trimester: Secondary | ICD-10-CM

## 2012-06-18 DIAGNOSIS — O479 False labor, unspecified: Secondary | ICD-10-CM | POA: Diagnosis present

## 2012-06-18 LAB — URINALYSIS, ROUTINE W REFLEX MICROSCOPIC
Ketones, ur: NEGATIVE mg/dL
Leukocytes, UA: NEGATIVE
Protein, ur: NEGATIVE mg/dL
Urobilinogen, UA: 0.2 mg/dL (ref 0.0–1.0)

## 2012-06-18 LAB — URINE MICROSCOPIC-ADD ON

## 2012-06-18 MED ORDER — BETAMETHASONE SOD PHOS & ACET 6 (3-3) MG/ML IJ SUSP
12.0000 mg | Freq: Once | INTRAMUSCULAR | Status: AC
  Administered 2012-06-18: 12 mg via INTRAMUSCULAR
  Filled 2012-06-18: qty 2

## 2012-06-18 MED ORDER — NIFEDIPINE 10 MG PO CAPS
30.0000 mg | ORAL_CAPSULE | Freq: Once | ORAL | Status: AC
  Administered 2012-06-18: 30 mg via ORAL
  Filled 2012-06-18: qty 3

## 2012-06-18 MED ORDER — IBUPROFEN 800 MG PO TABS: 800.0000 mg | ORAL_TABLET | Freq: Three times a day (TID) | ORAL | Status: DC | PRN

## 2012-06-18 MED ORDER — IBUPROFEN 800 MG PO TABS
800.0000 mg | ORAL_TABLET | Freq: Once | ORAL | Status: AC
  Administered 2012-06-18: 800 mg via ORAL
  Filled 2012-06-18: qty 1

## 2012-06-18 MED ORDER — NIFEDIPINE 10 MG PO CAPS: 10.0000 mg | ORAL_CAPSULE | Freq: Four times a day (QID) | ORAL | Status: DC | PRN

## 2012-06-18 NOTE — MAU Provider Note (Signed)
History   Karen Davidson is a 32y.o. BF at [redacted]w[redacted]d who presents for PTL eval and 2nd dose of BMZ.  Received first dose yesterday after having a pos FFN.  She reports feeling cramping and abdominal tightening since this afternoon.  Poor historical recount, but reports PTL w/ first child, w/ term delivery, but did get put on meds and bedrest.  Pt denies abnl d/c today or VB; some question as to having some yesterday.  Pt had cervical length done at office yesterday, and =3.34 cm, which was slightly longer than eval on her anatomy scan.  Pt denies recent illness or fever.  No resp c/o's.  No UTI s/s.   .. Patient Active Problem List  Diagnosis  . HSV-2 infection  . Cervical dysplasia  . Vaginal bleeding before [redacted] weeks gestation  . H/O cesarean section  . Anxiety  . Abnormal fetal ultrasound   CSN: [id number]  Arrival date and time: 06/18/12 1702   None     Chief Complaint  Patient presents with  . Back Pain  . Abdominal Cramping   HPI  OB History   Grav Para Term Preterm Abortions TAB SAB Ect Mult Living   4 1 1  2  2   1      Obstetric Comments   2007 PTL STARTING 12/2005;   BEDREST;  ON TERB      Past Medical History  Diagnosis Date  . HSV-2 infection   . Cervical dysplasia     Normal pap 05/2009,03/2010  . Anxiety 2009    SHORT TERM MEDS  . Abnormal Pap smear 2012    LEEP; LAST PAP 2013  . Preterm labor 2007  . Headache     FREQUENT;  3X/MONTH  . Infection     FREQUENT UTI DURING PREGNANCY  . Infection 2004    HSV2  . Laceration of arm 2004    SURGICAL REPAIR    Past Surgical History  Procedure Laterality Date  . Artery repair  2004    TENDON/ARTERY REPAIR RIGHT ARM  . Cervical biopsy  w/ loop electrode excision  2010    CIN II with positive endocervical margins  . Cesarean section  02/2006  . Wisdom tooth extraction  2011  . Tonsillectomy  AGE 48  . Tendon repair      Family History  Problem Relation Age of Onset  . Asthma Mother   . Liver disease  Mother     HEPATITIS C  . Mental illness Mother     BIPOLAR; SCHIZOPHRENIA  . Diabetes Mother   . Alcohol abuse Mother   . Drug abuse Mother   . Early death Mother 88    CAR ACCIDENT  . Drug abuse Father   . Alcohol abuse Father   . Asthma Brother   . Asthma Son   . Heart disease Son     MURMUR  . Mental illness Maternal Aunt     BIPOLAR  . Diabetes Maternal Aunt   . Arthritis Maternal Grandmother   . Diabetes Maternal Grandmother   . Hyperlipidemia Maternal Grandmother   . Hypertension Maternal Grandmother   . Kidney disease Maternal Grandmother   . Other Maternal Grandmother     VARICOSE VEINS  . Mental illness Maternal Grandfather     BIPOLAR  . Cancer Paternal Grandfather     PROSTATE  . Asthma Brother   . Mental illness Brother     BIPOLAR    History  Substance Use Topics  .  Smoking status: Never Smoker   . Smokeless tobacco: Never Used  . Alcohol Use: Yes     Comment: Rarely;  1 x month wine prior to pregnancy    Allergies: No Known Allergies  Facility-administered medications prior to admission  Medication Dose Route Frequency Provider Last Rate Last Dose  . [DISCONTINUED] misoprostol (CYTOTEC) tablet 400 mcg  400 mcg Vaginal Q4H Earl Gala, CNM       Prescriptions prior to admission  Medication Sig Dispense Refill  . penicillin v potassium (VEETID) 500 MG tablet Take 500 mg by mouth 4 (four) times daily. 10 day started on 06-17-12 for dental work.      . Prenatal Vit-Fe Sulfate-FA (PRENATAL VITAMIN PO) Take 1 tablet by mouth. OTC PNV WITH DHA        ROS--see HPI Physical Exam   Blood pressure 101/53, pulse 79, temperature 98.7 F (37.1 C), temperature source Oral, resp. rate 18, height 5' 4.5" (1.638 m), weight 148 lb (67.132 kg), last menstrual period 12/20/2011.  Physical Exam  Constitutional: She is oriented to person, place, and time. She appears well-developed and well-nourished. No distress.  HENT:  Head: Normocephalic and atraumatic.   Eyes: Pupils are equal, round, and reactive to light.  Cardiovascular: Normal rate.   Respiratory: Effort normal.  GI: Soft.  gravid  Genitourinary:  Cx: closed/50/-2, softening  Musculoskeletal: She exhibits no edema.  Neurological: She is alert and oriented to person, place, and time.  Skin: Skin is warm and dry.    MAU Course  Procedures 1. Prolonged monitoring: EFM=140, moderate variability, no decels, appropriate; TOCO: UI and irregular mild ctxs; rest palpated. 2. Motrin 800mg  po x1 at 2027; after dose, still feeling tightenings, so given procardia 3. Betamethasone 12mg  IM x1 4. Procardia 30mg  po x1(given at 2149)  Assessment and Plan  1. [redacted]w[redacted]d 2. PT contractions w/ spacing after above measures 3. Received 2nd dose of BMZ 4. Pos FFN 06/17/12 (question if a false positive) 5. FHT appropriate for GA 6. Previous c/s, desires VBAC  1. D/c home w/ PTL precautions and pelvic rest.  Note given to remain OOW until next appt to recheck cx.  Pt is scheduled for gtt at that appt, but instructed pt she needs to delay gtt until at least 2 weeks after 1st BMZ b/c of affect on serum glucose levels. 2. Rx'd Procardia 10mg  po q6hrs prn 6 or more ctxs per hr, and Motrin to use prn.  Other comfort measures rev'd. 3. F/u this week in the office as scheduled, or prn.   Grissel Tyrell H 06/18/2012, 10:50 PM

## 2012-06-18 NOTE — Progress Notes (Signed)
Written and verbal d/c instructions given and understanding voiced. 

## 2012-06-18 NOTE — MAU Note (Signed)
Patient presents to MAU for 2nd dose of BMZ; reports she is having lower back pain and lower abdominal pressure since 1330 today. Called office and was told to come in for eval.  Denies vaginal bleeding, LOF. Reports good fetal movement.

## 2012-07-07 ENCOUNTER — Encounter: Payer: Self-pay | Admitting: Obstetrics and Gynecology

## 2012-07-13 ENCOUNTER — Encounter: Payer: Self-pay | Admitting: Obstetrics and Gynecology

## 2012-09-09 LAB — OB RESULTS CONSOLE GBS: GBS: NEGATIVE

## 2012-09-21 ENCOUNTER — Inpatient Hospital Stay (HOSPITAL_COMMUNITY)
Admission: AD | Admit: 2012-09-21 | Discharge: 2012-09-23 | DRG: 774 | Disposition: A | Discharge status: Home / Self Care | Payer: 59 | Source: Ambulatory Visit | Attending: Obstetrics and Gynecology | Admitting: Obstetrics and Gynecology

## 2012-09-21 ENCOUNTER — Inpatient Hospital Stay (HOSPITAL_COMMUNITY): Payer: 59 | Admitting: Anesthesiology

## 2012-09-21 ENCOUNTER — Encounter (HOSPITAL_COMMUNITY): Payer: Self-pay | Admitting: Anesthesiology

## 2012-09-21 ENCOUNTER — Encounter (HOSPITAL_COMMUNITY): Payer: Self-pay | Admitting: *Deleted

## 2012-09-21 DIAGNOSIS — IMO0002 Reserved for concepts with insufficient information to code with codable children: Secondary | ICD-10-CM | POA: Diagnosis not present

## 2012-09-21 DIAGNOSIS — O1490 Unspecified pre-eclampsia, unspecified trimester: Secondary | ICD-10-CM | POA: Diagnosis not present

## 2012-09-21 DIAGNOSIS — O34219 Maternal care for unspecified type scar from previous cesarean delivery: Secondary | ICD-10-CM | POA: Diagnosis present

## 2012-09-21 LAB — PROTEIN / CREATININE RATIO, URINE
Creatinine, Urine: 37.49 mg/dL
Protein Creatinine Ratio: 0.57 — ABNORMAL HIGH (ref 0.00–0.15)
Total Protein, Urine: 21.3 mg/dL

## 2012-09-21 LAB — CBC
HCT: 35.2 % — ABNORMAL LOW (ref 36.0–46.0)
HCT: 38.5 % (ref 36.0–46.0)
Hemoglobin: 12.1 g/dL (ref 12.0–15.0)
Hemoglobin: 13 g/dL (ref 12.0–15.0)
MCHC: 34.4 g/dL (ref 30.0–36.0)
MCV: 91.9 fL (ref 78.0–100.0)
MCV: 91.4 fL (ref 78.0–100.0)
RDW: 15.8 % — ABNORMAL HIGH (ref 11.5–15.5)
RDW: 16 % — ABNORMAL HIGH (ref 11.5–15.5)
WBC: 8 10*3/uL (ref 4.0–10.5)
WBC: 9.6 10*3/uL (ref 4.0–10.5)

## 2012-09-21 LAB — COMPREHENSIVE METABOLIC PANEL
ALT: 20 U/L (ref 0–35)
AST: 39 U/L — ABNORMAL HIGH (ref 0–37)
Albumin: 2.5 g/dL — ABNORMAL LOW (ref 3.5–5.2)
CO2: 18 mEq/L — ABNORMAL LOW (ref 19–32)
Calcium: 9 mg/dL (ref 8.4–10.5)
Chloride: 99 mEq/L (ref 96–112)
GFR calc non Af Amer: 72 mL/min — ABNORMAL LOW (ref >90)
Sodium: 132 mEq/L — ABNORMAL LOW (ref 135–145)
Total Bilirubin: 0.4 mg/dL (ref 0.3–1.2)

## 2012-09-21 LAB — MRSA PCR SCREENING: MRSA by PCR: NEGATIVE

## 2012-09-21 LAB — TYPE AND SCREEN: ABO/RH(D): O POS

## 2012-09-21 MED ORDER — ZOLPIDEM TARTRATE 5 MG PO TABS: 5.0000 mg | ORAL_TABLET | Freq: Every evening | ORAL | Status: DC | PRN

## 2012-09-21 MED ORDER — LACTATED RINGERS IV SOLN: 500.0000 mL | INTRAVENOUS | Status: DC | PRN

## 2012-09-21 MED ORDER — FENTANYL 2.5 MCG/ML BUPIVACAINE 1/10 % EPIDURAL INFUSION (WH - ANES)
14.0000 mL/h | INTRAMUSCULAR | Status: DC | PRN
  Administered 2012-09-21: 14 mL/h via EPIDURAL

## 2012-09-21 MED ORDER — OXYTOCIN 40 UNITS IN LACTATED RINGERS INFUSION - SIMPLE MED: 62.5000 mL/h | INTRAVENOUS | Status: DC

## 2012-09-21 MED ORDER — EPHEDRINE 5 MG/ML INJ
10.0000 mg | INTRAVENOUS | Status: DC | PRN
  Filled 2012-09-21: qty 2

## 2012-09-21 MED ORDER — FENTANYL CITRATE 0.05 MG/ML IJ SOLN: 100.0000 ug | INTRAMUSCULAR | Status: DC | PRN

## 2012-09-21 MED ORDER — ONDANSETRON HCL 4 MG/2ML IJ SOLN
4.0000 mg | Freq: Four times a day (QID) | INTRAMUSCULAR | Status: DC | PRN
  Administered 2012-09-21: 4 mg via INTRAVENOUS
  Filled 2012-09-21: qty 2

## 2012-09-21 MED ORDER — MAGNESIUM SULFATE 40 MG/ML IJ SOLN: 4.0000 g | Freq: Once | INTRAMUSCULAR | Status: DC

## 2012-09-21 MED ORDER — TERBUTALINE SULFATE 1 MG/ML IJ SOLN: 0.2500 mg | Freq: Once | INTRAMUSCULAR | Status: DC | PRN

## 2012-09-21 MED ORDER — BENZOCAINE-MENTHOL 20-0.5 % EX AERO
1.0000 "application " | INHALATION_SPRAY | CUTANEOUS | Status: DC | PRN
  Administered 2012-09-22: 1 via TOPICAL
  Filled 2012-09-21: qty 56

## 2012-09-21 MED ORDER — FENTANYL 2.5 MCG/ML BUPIVACAINE 1/10 % EPIDURAL INFUSION (WH - ANES)
14.0000 mL/h | INTRAMUSCULAR | Status: DC | PRN
  Filled 2012-09-21 (×2): qty 125

## 2012-09-21 MED ORDER — PHENYLEPHRINE 40 MCG/ML (10ML) SYRINGE FOR IV PUSH (FOR BLOOD PRESSURE SUPPORT)
80.0000 ug | PREFILLED_SYRINGE | INTRAVENOUS | Status: DC | PRN
  Filled 2012-09-21: qty 2

## 2012-09-21 MED ORDER — LACTATED RINGERS IV SOLN
INTRAVENOUS | Status: DC
  Administered 2012-09-21 (×3): via INTRAVENOUS
  Administered 2012-09-21: 500 mL via INTRAVENOUS
  Administered 2012-09-21: 02:00:00 via INTRAVENOUS

## 2012-09-21 MED ORDER — OXYCODONE-ACETAMINOPHEN 5-325 MG PO TABS: 1.0000 | ORAL_TABLET | ORAL | Status: DC | PRN

## 2012-09-21 MED ORDER — LACTATED RINGERS IV SOLN
500.0000 mL | Freq: Once | INTRAVENOUS | Status: AC
  Administered 2012-09-21: 500 mL via INTRAVENOUS

## 2012-09-21 MED ORDER — EPHEDRINE 5 MG/ML INJ
10.0000 mg | INTRAVENOUS | Status: DC | PRN
  Filled 2012-09-21: qty 2
  Filled 2012-09-21: qty 4

## 2012-09-21 MED ORDER — LIDOCAINE HCL (PF) 1 % IJ SOLN
30.0000 mL | INTRAMUSCULAR | Status: DC | PRN
  Administered 2012-09-21: 30 mL via SUBCUTANEOUS
  Filled 2012-09-21 (×2): qty 30

## 2012-09-21 MED ORDER — SIMETHICONE 80 MG PO CHEW: 80.0000 mg | CHEWABLE_TABLET | ORAL | Status: DC | PRN

## 2012-09-21 MED ORDER — DIBUCAINE 1 % RE OINT: 1.0000 "application " | TOPICAL_OINTMENT | RECTAL | Status: DC | PRN

## 2012-09-21 MED ORDER — WITCH HAZEL-GLYCERIN EX PADS: 1.0000 "application " | MEDICATED_PAD | CUTANEOUS | Status: DC | PRN

## 2012-09-21 MED ORDER — OXYTOCIN BOLUS FROM INFUSION: 500.0000 mL | INTRAVENOUS | Status: DC

## 2012-09-21 MED ORDER — MAGNESIUM SULFATE 40 G IN LACTATED RINGERS - SIMPLE: 2.0000 g/h | INTRAVENOUS | Status: DC

## 2012-09-21 MED ORDER — ONDANSETRON HCL 4 MG/2ML IJ SOLN: 4.0000 mg | INTRAMUSCULAR | Status: DC | PRN

## 2012-09-21 MED ORDER — TETANUS-DIPHTH-ACELL PERTUSSIS 5-2.5-18.5 LF-MCG/0.5 IM SUSP
0.5000 mL | Freq: Once | INTRAMUSCULAR | Status: AC
  Administered 2012-09-22: 0.5 mL via INTRAMUSCULAR
  Filled 2012-09-21: qty 0.5

## 2012-09-21 MED ORDER — LACTATED RINGERS IV SOLN
INTRAVENOUS | Status: DC
  Administered 2012-09-21 – 2012-09-22 (×2): via INTRAVENOUS

## 2012-09-21 MED ORDER — IBUPROFEN 600 MG PO TABS
600.0000 mg | ORAL_TABLET | Freq: Four times a day (QID) | ORAL | Status: DC
  Administered 2012-09-21 – 2012-09-23 (×8): 600 mg via ORAL
  Filled 2012-09-21 (×8): qty 1

## 2012-09-21 MED ORDER — ONDANSETRON HCL 4 MG PO TABS: 4.0000 mg | ORAL_TABLET | ORAL | Status: DC | PRN

## 2012-09-21 MED ORDER — IBUPROFEN 600 MG PO TABS: 600.0000 mg | ORAL_TABLET | Freq: Four times a day (QID) | ORAL | Status: DC | PRN

## 2012-09-21 MED ORDER — LANOLIN HYDROUS EX OINT: TOPICAL_OINTMENT | CUTANEOUS | Status: DC | PRN

## 2012-09-21 MED ORDER — PRENATAL MULTIVITAMIN CH
1.0000 | ORAL_TABLET | Freq: Every day | ORAL | Status: DC
  Administered 2012-09-22 – 2012-09-23 (×2): 1 via ORAL
  Filled 2012-09-21 (×2): qty 1

## 2012-09-21 MED ORDER — OXYTOCIN 40 UNITS IN LACTATED RINGERS INFUSION - SIMPLE MED
1.0000 m[IU]/min | INTRAVENOUS | Status: DC
  Administered 2012-09-21: 666 m[IU]/min via INTRAVENOUS
  Administered 2012-09-21: 2 m[IU]/min via INTRAVENOUS
  Filled 2012-09-21: qty 1000

## 2012-09-21 MED ORDER — HYDROXYZINE HCL 50 MG PO TABS
50.0000 mg | ORAL_TABLET | Freq: Four times a day (QID) | ORAL | Status: DC | PRN
  Filled 2012-09-21: qty 1

## 2012-09-21 MED ORDER — LIDOCAINE HCL (PF) 1 % IJ SOLN
INTRAMUSCULAR | Status: DC | PRN
  Administered 2012-09-21: 9 mL
  Administered 2012-09-21: 8 mL

## 2012-09-21 MED ORDER — SENNOSIDES-DOCUSATE SODIUM 8.6-50 MG PO TABS
2.0000 | ORAL_TABLET | Freq: Every day | ORAL | Status: DC
  Administered 2012-09-21 – 2012-09-22 (×2): 2 via ORAL

## 2012-09-21 MED ORDER — CITRIC ACID-SODIUM CITRATE 334-500 MG/5ML PO SOLN
30.0000 mL | ORAL | Status: DC | PRN
  Administered 2012-09-21: 30 mL via ORAL
  Filled 2012-09-21: qty 15

## 2012-09-21 MED ORDER — MEDROXYPROGESTERONE ACETATE 150 MG/ML IM SUSP: 150.0000 mg | INTRAMUSCULAR | Status: DC | PRN

## 2012-09-21 MED ORDER — PHENYLEPHRINE 40 MCG/ML (10ML) SYRINGE FOR IV PUSH (FOR BLOOD PRESSURE SUPPORT)
80.0000 ug | PREFILLED_SYRINGE | INTRAVENOUS | Status: DC | PRN
  Filled 2012-09-21: qty 5
  Filled 2012-09-21: qty 2

## 2012-09-21 MED ORDER — MAGNESIUM SULFATE 40 G IN LACTATED RINGERS - SIMPLE
2.0000 g/h | INTRAVENOUS | Status: DC
  Administered 2012-09-21: 2 g/h via INTRAVENOUS
  Filled 2012-09-21: qty 500

## 2012-09-21 MED ORDER — MAGNESIUM SULFATE BOLUS VIA INFUSION
4.0000 g | Freq: Once | INTRAVENOUS | Status: AC
  Administered 2012-09-21: 4 g via INTRAVENOUS
  Filled 2012-09-21: qty 500

## 2012-09-21 MED ORDER — LACTATED RINGERS IV SOLN: 500.0000 mL | Freq: Once | INTRAVENOUS | Status: DC

## 2012-09-21 MED ORDER — DIPHENHYDRAMINE HCL 25 MG PO CAPS: 25.0000 mg | ORAL_CAPSULE | Freq: Four times a day (QID) | ORAL | Status: DC | PRN

## 2012-09-21 MED ORDER — DIPHENHYDRAMINE HCL 50 MG/ML IJ SOLN: 12.5000 mg | INTRAMUSCULAR | Status: DC | PRN

## 2012-09-21 MED ORDER — ACETAMINOPHEN 325 MG PO TABS: 650.0000 mg | ORAL_TABLET | ORAL | Status: DC | PRN

## 2012-09-21 MED ORDER — FENTANYL 2.5 MCG/ML BUPIVACAINE 1/10 % EPIDURAL INFUSION (WH - ANES)
INTRAMUSCULAR | Status: DC | PRN
  Administered 2012-09-21: 14 mL/h via EPIDURAL

## 2012-09-21 NOTE — MAU Note (Signed)
PT SAY SHE STARTED HURTING BAD AT  2100.   VE LAST Thursday - IN OFFICE   4 CM.    HX- HSV- LAST OUTBREAK- MAY-  TAKING VALTREX-  DENIES ANY OUTBREAK NOW..  DENIES MRSA

## 2012-09-21 NOTE — Progress Notes (Signed)
0915 Karen Davidson, cnm called and notified of no rest between uc's and change in fhr baseline with minimal variability at times.  Orders rec to d/c pitocin and iv fluid bolus.

## 2012-09-21 NOTE — Progress Notes (Signed)
  Subjective: Tachysystole with decel around 0912.  Decel resolved back to baseline with min variability.  UCs q1 min.  Objective: BP 143/95  Pulse 75  Temp(Src) 97.9 F (36.6 C) (Oral)  Resp 18  Ht 5\' 4"  (1.626 m)  Wt 166 lb (75.297 kg)  BMI 28.48 kg/m2  SpO2 100%  LMP 12/20/2011   Total I/O In: -  Out: 50 [Urine:50]  FHT:  Cat II UC:   regular, every 1 minutes  SVE:   Dilation: 8 Effacement (%): 100 Station: 0 Exam by:: Haroldine Laws, cnm  Assessment / Plan:  FSE on around 0900 Pitocin off and bolus around 0916 C/w Dr. Pennie Rushing  Labor: Active labor Preeclampsia: no s/s Fetal Wellbeing: Cat II Pain Control: Epidural I/D: GBS neg; SROM x 2.5 hours Anticipated MOD: SVD   Karen Davidson 09/21/2012, 10:09 AM

## 2012-09-21 NOTE — Progress Notes (Signed)
Subjective: Pt just received epidural around 0440.  She is now comfortable, but nauseated.  S.o. Asleep at bs.   Pt is in Rt lateral position.  Pitocin on 3 mu.    Objective: BP 142/68  Pulse 62  Temp(Src) 97.6 F (36.4 C) (Oral)  Resp 20  Ht 5\' 4"  (1.626 m)  Wt 166 lb (75.297 kg)  BMI 28.48 kg/m2  SpO2 100%  LMP 12/20/2011     .Marland Kitchen Filed Vitals:   09/21/12 0530 09/21/12 0531 09/21/12 0535 09/21/12 0536  BP:  133/69  142/68  Pulse: 65 63 59 62  Temp:      TempSrc:      Resp:  20  20  Height:      Weight:      SpO2: 100%  100%    FHT:  FHR: 135 bpm, variability: moderate,  accelerations:  Present,  decelerations:  Present questionable decel as finishing up w/ epidural; not tracing well UC:   irregular, every 2-5 minutes SVE:   Dilation: 4.5 Effacement (%): 80 Station: -2 Exam by:: L. Cresenzo, rN Deferred at present Labs: Lab Results  Component Value Date   WBC 8.0 09/21/2012   HGB 12.1 09/21/2012   HCT 35.2* 09/21/2012   MCV 91.9 09/21/2012   PLT 235 09/21/2012    Assessment / Plan: 1. [redacted]w[redacted]d 2. TOLAC 3. GBS neg 4. h/o HSV   Labor: Progressing on Pitocin, will continue to increase then AROM Preeclampsia:  intake and ouput balanced Fetal Wellbeing:  Category I Pain Control:  Epidural I/D:  n/a Anticipated MOD:  NSVD 1. CTO for labor progression; likely utilize IUPC once AROM or SROM.   2. Rec'd hourly position changes, and especially Exaggerated sims when able 3. C/w MD prn  Orson Rho H 09/21/2012, 5:43 AM

## 2012-09-21 NOTE — Anesthesia Preprocedure Evaluation (Signed)
Anesthesia Evaluation  Patient identified by MRN, date of birth, ID band Patient awake    Reviewed: Allergy & Precautions, H&P , NPO status , Patient's Chart, lab work & pertinent test results  Airway Mallampati: I TM Distance: >3 FB Neck ROM: full    Dental no notable dental hx.    Pulmonary neg pulmonary ROS,    Pulmonary exam normal       Cardiovascular negative cardio ROS      Neuro/Psych  Headaches,    GI/Hepatic negative GI ROS, Neg liver ROS,   Endo/Other  negative endocrine ROS  Renal/GU negative Renal ROS     Musculoskeletal negative musculoskeletal ROS (+)   Abdominal Normal abdominal exam  (+)   Peds negative pediatric ROS (+)  Hematology negative hematology ROS (+)   Anesthesia Other Findings   Reproductive/Obstetrics (+) Pregnancy                           Anesthesia Physical Anesthesia Plan  ASA: II  Anesthesia Plan: Epidural   Post-op Pain Management:    Induction:   Airway Management Planned:   Additional Equipment:   Intra-op Plan:   Post-operative Plan:   Informed Consent: I have reviewed the patients History and Physical, chart, labs and discussed the procedure including the risks, benefits and alternatives for the proposed anesthesia with the patient or authorized representative who has indicated his/her understanding and acceptance.     Plan Discussed with:   Anesthesia Plan Comments:         Anesthesia Quick Evaluation

## 2012-09-21 NOTE — H&P (Signed)
Karen Davidson is a 32 y.o. female, G3P1011 at [redacted]w[redacted]d, presenting for labor check.  C/o painful, irregular UCs since about 2100 last night.  Just before leaving home pt reports UCs Q3 mins with some at Q5 min and Q15 min.  Denies VB, LOF, recent fever, resp or GI c/o's,UTI or PIH s/s . GFM. Desires epidural.  Patient Active Problem List   Diagnosis Date Noted  . Desires VBAC (vaginal birth after cesarean) trial 09/21/2012  . Preterm contractions 06/18/2012  . Abnormal fetal ultrasound 04/27/2012  . H/O cesarean section 03/08/2012  . Anxiety 03/08/2012  . Vaginal bleeding before [redacted] weeks gestation 09/30/2011  . HSV-2 infection   . Cervical dysplasia   Heartburn  History of present pregnancy: Patient entered care at 11 weeks.   EDC of 09/25/12 was established by [redacted]w[redacted]d U/S.   Anatomy scan:  18 weeks, with normal findings and an posterior placenta.  Mild fetal pyelectasis Additional [place] evaluations:  At [redacted]w[redacted]d - No previa, no pyelectasis.   Significant prenatal events:  Valtrex prophylaxis initiated at 34 weeks   Last evaluation:  09/16/12 at [redacted]w[redacted]d  OB History   Grav Para Term Preterm Abortions TAB SAB Ect Mult Living   3 1 1  1  1   1      Obstetric Comments   2007 PTL STARTING 12/2005;   BEDREST;  ON TERB     Past Medical History  Diagnosis Date  . HSV-2 infection   . Cervical dysplasia     Normal pap 05/2009,03/2010  . Anxiety 2009    SHORT TERM MEDS  . Abnormal Pap smear 2012    LEEP; LAST PAP 2013  . Preterm labor 2007  . Headache(784.0)     FREQUENT;  3X/MONTH  . Infection     FREQUENT UTI DURING PREGNANCY  . Infection 2004    HSV2  . Laceration of arm 2004    SURGICAL REPAIR   Past Surgical History  Procedure Laterality Date  . Artery repair  2004    TENDON/ARTERY REPAIR RIGHT ARM  . Cervical biopsy  w/ loop electrode excision  2010    CIN II with positive endocervical margins  . Cesarean section  02/2006  . Wisdom tooth extraction  2011  . Tonsillectomy  AGE 8  .  Tendon repair     Family History: family history includes Alcohol abuse in her father and mother; Arthritis in her maternal grandmother; Asthma in her brothers, mother, and son; Cancer in her paternal grandfather; Diabetes in her maternal aunt, maternal grandmother, and mother; Drug abuse in her father and mother; Early death (age of onset: 15) in her mother; Heart disease in her son; Hyperlipidemia in her maternal grandmother; Hypertension in her maternal grandmother; Kidney disease in her maternal grandmother; Liver disease in her mother; Mental illness in her brother, maternal aunt, maternal grandfather, and mother; and Other in her maternal grandmother. Social History:  reports that she has never smoked. She has never used smokeless tobacco. She reports that  drinks alcohol. She reports that she does not use illicit drugs.   Prenatal Transfer Tool  Maternal Diabetes: No Genetic Screening: Declined Maternal Ultrasounds/Referrals: Normal Fetal Ultrasounds or other Referrals:  None Maternal Substance Abuse:  No Significant Maternal Medications:  Meds include: Other: Valtrex initiated at 34 weeks Significant Maternal Lab Results: Lab values include: Group B Strep negative    ROS: see HPI above, all other systems are negative  No Known Allergies   Per HS at admission  5/80/-2, intact w/ BBOW, asynclitic   Blood pressure 145/85, pulse 73, temperature 97.9 F (36.6 C), temperature source Oral, resp. rate 18, height 5\' 4"  (1.626 m), weight 166 lb (75.297 kg), last menstrual period 12/20/2011, SpO2 100.00%.  Chest clear Heart RRR without murmur Abd gravid, NT Ext: WNL  FHR: per HS at admission Fetal Monitor Review: Mode: ultrasound.  Baseline rate: 135.  Variability: moderate (6-25 bpm).  Pattern: accelerations present and no decelerations.  Fetal State Assessment: Category I - tracings are normal.   UCs:  Irregular  Prenatal labs: ABO, Rh: --/--/O POS (07/01 0200) Antibody: NEG  (07/01 0200) Rubella:   Immune RPR: Nonreactive (12/16 0000)  HBsAg:   unknown HIV: Non-reactive (12/16 0000)  GBS: Negative (06/19 0000) Sickle cell/Hgb electrophoresis:  n/a Pap:  unknown GC:  neg Chlamydia:  neg Genetic screenings:   Glucola:  103 Other:  n/a    Assessment/Plan: 1. [redacted]w[redacted]d  2. Early labor  3. Desires TOLAC  4. CAT I FHT  5. GBS neg  6. Irregular ctxs w/ advanced dilatation  7. H/o HSV-2 (on longterm Valtrex)  8. H/o LEEP   1. Admit to [facility] w/ Dr. Stefano Gaul as attending  2. Routine L&D orders, epidural prn pt request or before starting pitocin if desires  3. Disc'd w/ pt advanced dilatation, but irregular ctx pattern. Pt given options of d/c home to await better pattern or admit and if pattern didn't become more consistent in next couple hours, would recommend Pitocin for augmentation. Disc'd increased risk of repeat c/s w/ pitocin/interventions, and fetal distress. Pt stated,"I'm ready to have a baby, and I don't want to go home." She verbalized understanding of risks w/ augmentation, and consented to Pitocin.  4. C/w MD prn  Rowan Blase, MSN 09/21/2012, 8:59 AM

## 2012-09-21 NOTE — Consult Note (Signed)
Neonatology Note:  Attendance at Code Apgar:  Our team responded to a Code Apgar call to room # 164 following NSVD complicated by shoulder dystocia, due to infant with apnea. The requesting physician was Jennifer Oxley, CNM. The mother is a G3P1A1 O pos, GBS neg with history fo HSV, on Valtrex. ROM occurred 7 hours PTD and the fluid was clear. There was decreased FHR variability during labor and some decelerations with pushing. At delivery, the baby was floppy, blue, and apneic. The OB nursing staff in attendance gave vigorous stimulation and a Code Apgar was called. Our team arrived at just after 1 min of life minutes of life, at which time the baby was still floppy, with a HR > 100, blue color in BBO2, but breathing. Pulse oximetry showed the O2 saturations were in the 90s, so BBO2 was stopped and the O2 saturations remained normal. We did deep suctioning with a DeLee catheter for 1-2 ml thick, clear mucous. Breath sounds were equal with good air exchange and referred upper airway sounds still audible, but not causing any decrease in O2 saturations. Tone returned gradually and was normal by 8-9 min of life, without focal deficits. Ap 2/8/9. The cord pH was 7.06. I spoke with the parents in the DR, then transferred the baby to the Pediatrician's care.  Rupal Childress C. Bani Gianfrancesco, MD  

## 2012-09-21 NOTE — Anesthesia Procedure Notes (Signed)
Epidural Patient location during procedure: OB Start time: 09/21/2012 4:53 AM End time: 09/21/2012 4:57 AM  Staffing Anesthesiologist: Sandrea Hughs Performed by: anesthesiologist   Preanesthetic Checklist Completed: patient identified, surgical consent, pre-op evaluation, timeout performed, IV checked, risks and benefits discussed and monitors and equipment checked  Epidural Patient position: sitting Prep: site prepped and draped and DuraPrep Patient monitoring: continuous pulse ox and blood pressure Approach: midline Injection technique: LOR air  Needle:  Needle type: Tuohy  Needle gauge: 17 G Needle length: 9 cm and 9 Needle insertion depth: 4 cm Catheter type: closed end flexible Catheter size: 19 Gauge Catheter at skin depth: 9 cm Test dose: negative and Other  Assessment Sensory level: T9 Events: blood not aspirated, injection not painful, no injection resistance, negative IV test and no paresthesia  Additional Notes Reason for block:procedure for pain

## 2012-09-21 NOTE — Progress Notes (Signed)
  Subjective: TC from RN, regarding UCs pattern and FHT.  UCs are Q1 min with MVUs at 70-135.  FSE placed by RN around 0900.  BL 150; Variability min; Accelerations absent; Variables present  Objective: BP 134/78  Pulse 74  Temp(Src) 97.9 F (36.6 C) (Oral)  Resp 20  Ht 5\' 4"  (1.626 m)  Wt 166 lb (75.297 kg)  BMI 28.48 kg/m2  SpO2 100%  LMP 12/20/2011   Total I/O In: -  Out: 50 [Urine:50]  FHT:  Cat II UC:   regular, every 1 minutes  SVE:   Dilation: 7 Effacement (%): 100 Station: 0 Exam by:: Currie Paris, RN  Assessment / Plan:  Pitocin turned off; Bolus of ; reevaluate effectiveness of interventions  Labor: Active labor with pitocin Preeclampsia: no s/s Fetal Wellbeing: Cat II Pain Control: Epidural I/D: GBS neg; SROM x 1 hour Anticipated MOD: SVD vs C/S   Karen Davidson 09/21/2012, 9:14 AM

## 2012-09-21 NOTE — Progress Notes (Signed)
  Subjective: Pt comfortable with epidural.  SROM just before entering room.  Discussed R/B of IUPC including infection, pt voiced understanding and was agreeable.  Objective: BP 122/50  Pulse 64  Temp(Src) 98.4 F (36.9 C) (Axillary)  Resp 18  Ht 5\' 4"  (1.626 m)  Wt 166 lb (75.297 kg)  BMI 28.48 kg/m2  SpO2 100%  LMP 12/20/2011      FHT:  FHR: 150 bpm, variability: moderate,  accelerations:  Present,  decelerations:  Present 1 episode around 0727, unclear tracing, may have been pt's position; will continue to monitor  Cat II UC:   regular, every 1.5-3 minutes  SVE:   Dilation: 6 Effacement (%): 80 Station: -2 Exam by:: Haroldine Laws, cnm  Assessment / Plan:  IUPC inserted  Labor: Active labor with irregular UC pattern Preeclampsia: no s/s Fetal Wellbeing: Cat II, position change done with resolution; will continue to monitor Pain Control: Epidural I/D: GBS neg; SROM around 0745 this am Anticipated MOD: TOLAC; SVD   Karen Davidson 09/21/2012, 7:53 AM

## 2012-09-21 NOTE — H&P (Signed)
Karen Davidson is a 32 y.o.black female presenting unannounced for labor check at [redacted]w[redacted]d w/ CC of painful, but irregular ctxs since about 2100 last night.  Just before leaving home, reports ctxs about every 3 min, but some had been q41min, and some q15.  No LOF or VB.  Denies recent illness or fever.  Normal FM. Accompanied by s.o.  No UTI or PIH s/s.    Prenatal Course: Patient entered care at 11 weeks.  EDC of 09/25/12 was established by [redacted]w[redacted]d U/S.  Anatomy scan: 18 weeks, with normal findings and an posterior placenta. Mild fetal pyelectasis  Additional [place] evaluations: At [redacted]w[redacted]d - No previa, no pyelectasis.  Significant prenatal events: Valtrex prophylaxis initiated at 34 weeks  Last evaluation: 09/16/12 at [redacted]w[redacted]d  OB Hx: G1=LTCS '07 at [facility] for FTP after 9hrs of labor; M=6+12; Dr. Audie Box G2=SAB '13 at 9 weeks; (blighted ovum) took cytotec G3=current Maternal Medical History:  Reason for admission: Contractions.   Contractions: Onset was 2 days ago.   Frequency: irregular.   Perceived severity is moderate.    Fetal activity: Perceived fetal activity is normal.   Last perceived fetal movement was within the past hour.      OB History   Grav Para Term Preterm Abortions TAB SAB Ect Mult Living   3 1 1  1  1   1      Obstetric Comments   2007 PTL STARTING 12/2005;   BEDREST;  ON TERB     Past Medical History  Diagnosis Date  . HSV-2 infection   . Cervical dysplasia     Normal pap 05/2009,03/2010  . Anxiety 2009    SHORT TERM MEDS  . Abnormal Pap smear 2012    LEEP; LAST PAP 2013  . Preterm labor 2007  . Headache(784.0)     FREQUENT;  3X/MONTH  . Infection     FREQUENT UTI DURING PREGNANCY  . Infection 2004    HSV2  . Laceration of arm 2004    SURGICAL REPAIR   Past Surgical History  Procedure Laterality Date  . Artery repair  2004    TENDON/ARTERY REPAIR RIGHT ARM  . Cervical biopsy  w/ loop electrode excision  2010    CIN II with positive endocervical margins  .  Cesarean section  02/2006  . Wisdom tooth extraction  2011  . Tonsillectomy  AGE 32  . Tendon repair     Family History: family history includes Alcohol abuse in her father and mother; Arthritis in her maternal grandmother; Asthma in her brothers, mother, and son; Cancer in her paternal grandfather; Diabetes in her maternal aunt, maternal grandmother, and mother; Drug abuse in her father and mother; Early death (age of onset: 55) in her mother; Heart disease in her son; Hyperlipidemia in her maternal grandmother; Hypertension in her maternal grandmother; Kidney disease in her maternal grandmother; Liver disease in her mother; Mental illness in her brother, maternal aunt, maternal grandfather, and mother; and Other in her maternal grandmother. Social History:  reports that she has never smoked. She has never used smokeless tobacco. She reports that  drinks alcohol. 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   Prenatal Transfer Tool  Maternal Diabetes: No Genetic Screening: Declined Maternal Ultrasounds/Referrals: Abnormal:  Findings:   Fetal renal pyelectasis Fetal Ultrasounds or other Referrals:  None Maternal Substance Abuse:  No  Significant Maternal Medications:  Meds include: Other:  Significant Maternal Lab Results:  Lab values include: Group B Strep negative Other Comments:  long-term Valtrex; last outbreak in May  Review of Systems  Constitutional: Negative.   HENT: Positive for congestion.   Eyes: Negative.   Respiratory: Negative.   Cardiovascular: Negative.   Genitourinary: Negative.   Skin: Negative.   Neurological: Positive for headaches.    Dilation: 4 Effacement (%): 80 Exam by:: H Nakeesha Bowler CNM Blood pressure 128/79, pulse 78, temperature 98.6 F (37 C), temperature source Oral, resp. rate 20, height 6\' 2"  (1.88 m), weight 168 lb (76.204 kg),  last menstrual period 12/20/2011. Maternal Exam:  Uterine Assessment: Contraction strength is mild.  Contraction frequency is irregular.   Abdomen: Patient reports no abdominal tenderness. Surgical scars: low transverse.   Fetal presentation: vertex  Introitus: Normal vulva. Raised 0.5cm bump on Rt labia majora, but not herpetic in nature, no drainage, no head  Pelvis: adequate for delivery.   Cervix: Cervix evaluated by sterile speculum exam and digital exam.     Fetal Exam Fetal Monitor Review: Mode: ultrasound.   Baseline rate: 135.  Variability: moderate (6-25 bpm).   Pattern: accelerations present and no decelerations.    Fetal State Assessment: Category I - tracings are normal.     Physical Exam  Constitutional: She is oriented to person, place, and time. She appears well-developed and well-nourished.  Breathes w/ some ctxs  HENT:  Head: Normocephalic and atraumatic.  Eyes: Pupils are equal, round, and reactive to light.  Cardiovascular: Normal rate.   Respiratory: Effort normal.  GI: Soft.  gravid  Genitourinary:  5/80/-2, intact w/ BBOW, asynclitic  Musculoskeletal: She exhibits no edema.  Neurological: She is alert and oriented to person, place, and time. She has normal reflexes.  Skin: Skin is warm and dry.  Psychiatric: She has a normal mood and affect. Her behavior is normal. Judgment and thought content normal.    Prenatal labs: ABO, Rh:  O pos  Antibody:  negative Rubella:  immune RPR:   NR HBsAg:   negative HIV:   negative GBS:   negative 09/09/12  Assessment/Plan: 1. [redacted]w[redacted]d 2. Early labor 3. Desires TOLAC 4. CAT I FHT 5. GBS neg 6. Irregular ctxs w/ advanced dilatation 7. 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 H/o LEEP  1. Admit to [facility] w/ Dr. Stefano Gaul as attending 2. Routine L&D orders, epidural prn pt request or before starting pitocin if desires 3. Disc'd w/ pt advanced dilatation, but irregular ctx pattern.  Pt given options of d/c home to  await better pattern or admit and if pattern didn't become more consistent in next couple hours, would recommend Pitocin for augmentation.  Disc'd increased risk of repeat c/s w/ pitocin/interventions, and fetal distress.  Pt stated,"I'm ready to have a baby, and I don't want to go home."  She verbalized understanding of risks w/ augmentation, and consented to Pitocin. 4. C/w MD prn   Harlea Goetzinger H 09/21/2012, 1:43 AM

## 2012-09-22 LAB — COMPREHENSIVE METABOLIC PANEL
ALT: 20 U/L (ref 0–35)
AST: 45 U/L — ABNORMAL HIGH (ref 0–37)
Alkaline Phosphatase: 141 U/L — ABNORMAL HIGH (ref 39–117)
CO2: 24 mEq/L (ref 19–32)
Calcium: 9.2 mg/dL (ref 8.4–10.5)
Chloride: 103 mEq/L (ref 96–112)
GFR calc Af Amer: >90 mL/min (ref >90)
GFR calc non Af Amer: 78 mL/min — ABNORMAL LOW (ref >90)
Glucose, Bld: 107 mg/dL — ABNORMAL HIGH (ref 70–99)
Sodium: 137 mEq/L (ref 135–145)
Total Bilirubin: 0.3 mg/dL (ref 0.3–1.2)

## 2012-09-22 LAB — CBC
Hemoglobin: 11.5 g/dL — ABNORMAL LOW (ref 12.0–15.0)
MCHC: 35.3 g/dL (ref 30.0–36.0)
Platelets: 214 10*3/uL (ref 150–400)
RDW: 15.9 % — ABNORMAL HIGH (ref 11.5–15.5)

## 2012-09-22 LAB — LACTATE DEHYDROGENASE: LDH: 298 U/L — ABNORMAL HIGH (ref 94–250)

## 2012-09-22 LAB — URIC ACID: Uric Acid, Serum: 8.8 mg/dL — ABNORMAL HIGH (ref 2.4–7.0)

## 2012-09-22 MED ORDER — SODIUM CHLORIDE 0.9 % IJ SOLN: 3.0000 mL | INTRAMUSCULAR | Status: DC | PRN

## 2012-09-22 MED ORDER — MAGNESIUM SULFATE 40 G IN LACTATED RINGERS - SIMPLE
2.0000 g/h | INTRAVENOUS | Status: AC
  Administered 2012-09-22: 2 g/h via INTRAVENOUS
  Filled 2012-09-22 (×2): qty 500

## 2012-09-22 MED ORDER — LACTATED RINGERS IV SOLN
INTRAVENOUS | Status: AC
  Administered 2012-09-22: 14:00:00 via INTRAVENOUS

## 2012-09-22 MED ORDER — LACTATED RINGERS IV SOLN: INTRAVENOUS | Status: DC

## 2012-09-22 MED ORDER — SODIUM CHLORIDE 0.9 % IJ SOLN
3.0000 mL | Freq: Two times a day (BID) | INTRAMUSCULAR | Status: DC
  Administered 2012-09-22: 3 mL via INTRAVENOUS

## 2012-09-22 MED ORDER — NIFEDIPINE ER 30 MG PO TB24
30.0000 mg | ORAL_TABLET | Freq: Every day | ORAL | Status: DC
  Administered 2012-09-22 – 2012-09-23 (×2): 30 mg via ORAL
  Filled 2012-09-22 (×2): qty 1

## 2012-09-22 NOTE — Anesthesia Postprocedure Evaluation (Signed)
  Anesthesia Post-op Note  Patient: Karen Davidson  Procedure(s) Performed: * No procedures listed *  Patient Location: PACU and A-ICU  Anesthesia Type:Epidural  Level of Consciousness: awake, alert  and oriented  Airway and Oxygen Therapy: Patient Spontanous Breathing  Post-op Pain: mild  Post-op Assessment: Patient's Cardiovascular Status Stable, Respiratory Function Stable, No signs of Nausea or vomiting, Adequate PO intake, Pain level controlled, No headache, No backache, No residual numbness and No residual motor weakness  Post-op Vital Signs: stable  Complications: No apparent anesthesia complications

## 2012-09-22 NOTE — Progress Notes (Signed)
Phone report to Dr. Normand Sloop prior to pt transfer to [facility] - orders rec'd.

## 2012-09-22 NOTE — Progress Notes (Signed)
Phone report to [place], RN (charrge [employer]) re transfer.  Full report given re VS, PIH S/S.  Will call Dr. Normand Sloop prior to transfer for review.

## 2012-09-22 NOTE — Progress Notes (Signed)
UR chart review completed.  

## 2012-09-22 NOTE — Progress Notes (Signed)
Post Partum Day 1 Subjective: voiding, tolerating PO, + flatus and she has a mild headache  Objective: Blood pressure 151/89, pulse 92, temperature 97.8 F (36.6 C), temperature source Oral, resp. rate 18, height 5\' 4"  (1.626 m), weight 155 lb 9.6 oz (70.58 kg), last menstrual period 12/20/2011, SpO2 99.00%, unknown if currently breastfeeding.  Physical Exam:  General: alert and cooperative Lochia: appropriate Uterine Fundus: firm Incision: na DVT Evaluation: No evidence of DVT seen on physical exam. 2 plus DTR B   Recent Labs  09/21/12 1557 09/22/12 0520  HGB 13.0 11.5*  HCT 38.5 32.6*    Assessment/Plan: Plan for discharge tomorrow Will DC magnesium at 4:30 Will start procardia if needed   LOS: 1 day   Alger Kerstein A 09/22/2012, 12:44 PM

## 2012-09-22 NOTE — Progress Notes (Signed)
Post Partum Day 1: S/P VBAC with a 2nd degree lac  Subjective: Patient up once this morning, denies syncope or dizziness.  Reports soreness in legs and perineum, relieved by motrin.  24 hour Magnesium Sulfate continuing   Feeding:  Bottle Contraceptive plan:   Mirena  Objective: Blood pressure 153/85, pulse 85, temperature 98.3 F (36.8 C), temperature source Oral, resp. rate 18, height 5\' 4"  (1.626 m), weight 155 lb 9.6 oz (70.58 kg), last menstrual period 12/20/2011, SpO2 100.00%, unknown if currently breastfeeding.  Physical Exam:  General: alert, cooperative and no distress Lochia: appropriate Uterine Fundus: firm Incision: healing well DVT Evaluation: No evidence of DVT seen on physical exam. Negative Homan's sign.  BPs: ast 12 hours 144-157 / 80-99  Results for orders placed during the hospital encounter of 09/21/12 (from the past 24 hour(s))  PROTEIN / CREATININE RATIO, URINE     Status: Abnormal   Collection Time    09/21/12 11:40 AM      Result Value Range   Creatinine, Urine 37.49     Total Protein, Urine 21.3     PROTEIN CREATININE RATIO 0.57 (*) 0.00 - 0.15  COMPREHENSIVE METABOLIC PANEL     Status: Abnormal   Collection Time    09/21/12  3:57 PM      Result Value Range   Sodium 132 (*) 135 - 145 mEq/L   Potassium 3.3 (*) 3.5 - 5.1 mEq/L   Chloride 99  96 - 112 mEq/L   CO2 18 (*) 19 - 32 mEq/L   Glucose, Bld 103 (*) 70 - 99 mg/dL   BUN 10  6 - 23 mg/dL   Creatinine, Ser 1.61  0.50 - 1.10 mg/dL   Calcium 9.0  8.4 - 09.6 mg/dL   Total Protein 6.1  6.0 - 8.3 g/dL   Albumin 2.5 (*) 3.5 - 5.2 g/dL   AST 39 (*) 0 - 37 U/L   ALT 20  0 - 35 U/L   Alkaline Phosphatase 171 (*) 39 - 117 U/L   Total Bilirubin 0.4  0.3 - 1.2 mg/dL   GFR calc non Af Amer 72 (*) >90 mL/min   GFR calc Af Amer 83 (*) >90 mL/min  LACTATE DEHYDROGENASE     Status: Abnormal   Collection Time    09/21/12  3:57 PM      Result Value Range   LDH 312 (*) 94 - 250 U/L  URIC ACID     Status:  Abnormal   Collection Time    09/21/12  3:57 PM      Result Value Range   Uric Acid, Serum 8.6 (*) 2.4 - 7.0 mg/dL  CBC     Status: Abnormal   Collection Time    09/21/12  3:57 PM      Result Value Range   WBC 9.6  4.0 - 10.5 K/uL   RBC 4.21  3.87 - 5.11 MIL/uL   Hemoglobin 13.0  12.0 - 15.0 g/dL   HCT 04.5  40.9 - 81.1 %   MCV 91.4  78.0 - 100.0 fL   MCH 30.9  26.0 - 34.0 pg   MCHC 33.8  30.0 - 36.0 g/dL   RDW 91.4 (*) 78.2 - 95.6 %   Platelets 205  150 - 400 K/uL  MRSA PCR SCREENING     Status: None   Collection Time    09/21/12  7:30 PM      Result Value Range   MRSA by PCR NEGATIVE  NEGATIVE  MAGNESIUM     Status: Abnormal   Collection Time    09/21/12 11:25 PM      Result Value Range   Magnesium 4.8 (*) 1.5 - 2.5 mg/dL  MAGNESIUM     Status: Abnormal   Collection Time    09/22/12  5:20 AM      Result Value Range   Magnesium 5.2 (*) 1.5 - 2.5 mg/dL  COMPREHENSIVE METABOLIC PANEL     Status: Abnormal   Collection Time    09/22/12  5:20 AM      Result Value Range   Sodium 137  135 - 145 mEq/L   Potassium 3.6  3.5 - 5.1 mEq/L   Chloride 103  96 - 112 mEq/L   CO2 24  19 - 32 mEq/L   Glucose, Bld 107 (*) 70 - 99 mg/dL   BUN 6  6 - 23 mg/dL   Creatinine, Ser 1.61  0.50 - 1.10 mg/dL   Calcium 9.2  8.4 - 09.6 mg/dL   Total Protein 5.1 (*) 6.0 - 8.3 g/dL   Albumin 2.1 (*) 3.5 - 5.2 g/dL   AST 45 (*) 0 - 37 U/L   ALT 20  0 - 35 U/L   Alkaline Phosphatase 141 (*) 39 - 117 U/L   Total Bilirubin 0.3  0.3 - 1.2 mg/dL   GFR calc non Af Amer 78 (*) >90 mL/min   GFR calc Af Amer >90  >90 mL/min  LACTATE DEHYDROGENASE     Status: Abnormal   Collection Time    09/22/12  5:20 AM      Result Value Range   LDH 298 (*) 94 - 250 U/L  URIC ACID     Status: Abnormal   Collection Time    09/22/12  5:20 AM      Result Value Range   Uric Acid, Serum 8.8 (*) 2.4 - 7.0 mg/dL  CBC     Status: Abnormal   Collection Time    09/22/12  5:20 AM      Result Value Range   WBC 12.2 (*)  4.0 - 10.5 K/uL   RBC 3.58 (*) 3.87 - 5.11 MIL/uL   Hemoglobin 11.5 (*) 12.0 - 15.0 g/dL   HCT 04.5 (*) 40.9 - 81.1 %   MCV 91.1  78.0 - 100.0 fL   MCH 32.1  26.0 - 34.0 pg   MCHC 35.3  30.0 - 36.0 g/dL   RDW 91.4 (*) 78.2 - 95.6 %   Platelets 214  150 - 400 K/uL    Assessment/Plan: S/P Vaginal delivery day 1 Pre-Eclampsia 24 hour mag sulfate  Continue current care D/c Mag sulfate at 1630 Plan for discharge tomorrow   LOS: 1 day   Alpha Mysliwiec 09/22/2012, 8:28 AM

## 2012-09-22 NOTE — Progress Notes (Signed)
Patient was referred for history of depression/anxiety.  * Referral screened out by Clinical Social Worker because none of the following criteria appear to apply:  ~ History of anxiety/depression during this pregnancy, or of post-partum depression.  ~ Diagnosis of anxiety and/or depression within last 3 years  ~ History of depression due to pregnancy loss/loss of child  OR  * Patient's symptoms currently being treated with medication and/or therapy.  Please contact the Clinical Social Worker if needs arise, or by the patient's request.  Pt denies history even though she was prescribed medication 2 years ago.       

## 2012-09-23 LAB — COMPREHENSIVE METABOLIC PANEL
ALT: 32 U/L (ref 0–35)
AST: 63 U/L — ABNORMAL HIGH (ref 0–37)
CO2: 27 mEq/L (ref 19–32)
Chloride: 102 mEq/L (ref 96–112)
GFR calc non Af Amer: >90 mL/min (ref >90)
Potassium: 3.2 mEq/L — ABNORMAL LOW (ref 3.5–5.1)
Sodium: 137 mEq/L (ref 135–145)
Total Bilirubin: 0.2 mg/dL — ABNORMAL LOW (ref 0.3–1.2)

## 2012-09-23 LAB — CBC
Platelets: 229 10*3/uL (ref 150–400)
RBC: 3.34 MIL/uL — ABNORMAL LOW (ref 3.87–5.11)
WBC: 10.4 10*3/uL (ref 4.0–10.5)

## 2012-09-23 MED ORDER — OXYCODONE-ACETAMINOPHEN 5-325 MG PO TABS: 1.0000 | ORAL_TABLET | ORAL | Status: DC | PRN

## 2012-09-23 MED ORDER — NIFEDIPINE ER 30 MG PO TB24: 30.0000 mg | ORAL_TABLET | Freq: Every day | ORAL | Status: DC

## 2012-09-23 MED ORDER — IBUPROFEN 600 MG PO TABS: 600.0000 mg | ORAL_TABLET | Freq: Four times a day (QID) | ORAL | Status: DC | PRN

## 2012-09-23 NOTE — Discharge Summary (Signed)
Vaginal Delivery Discharge Summary  Karen Davidson  DOB:    1981-03-07 MRN:    [id number] CSN:    [id number]  Date of admission:                  09/21/12  Date of discharge:                   09/23/12  Procedures this admission:  Date of Delivery: 09/21/12.  Newborn Data:  Live born female  Birth Weight: 7 lb 14.3 oz (3580 g) APGAR: 2, 8  Home with mother.   History of Present Illness:  Ms. Karen Davidson is a 32 y.o. female, [id number], who presents at [redacted]w[redacted]d weeks gestation. The patient has been followed at the [facility] and Gynecology division of [facility]. She was admitted onset of labor. Her pregnancy has been complicated by:  Patient Active Problem List   Diagnosis Date Noted  . VBAC, delivered 09/21/2012  . Second-degree perineal laceration, with delivery 09/21/2012  . Shoulder dystocia 09/21/2012  . Preeclampsia 09/21/2012  . Abnormal fetal ultrasound 04/27/2012  . H/O cesarean section 03/08/2012  . Anxiety 03/08/2012  . Vaginal bleeding before [redacted] weeks gestation 09/30/2011  . HSV-2 infection   . Cervical dysplasia      Hospital course:  The patient was admitted for labor.   Her labor was not complicated. She proceeded to have a VBAC of a healthy infant. Her delivery was complicated by mild shoulder dystocia, relieved by McRoberts maneuver.  Infant had 2/8 Apgars and was evaluated by the NICU team.   Patient had some elevated BPs during labor, with a protein/creatnine ratio that was elevated.  She was placed on magnesium sulfate for 24 hours, then started on Procardia 30 mg XL on the evening of day 1 due to continuing elevation of BPs (150s/88s).  Her AST was slightly elevated on admission, with uric acid also elevated.  Her platelet count was WNL.  After completion of 24 hours of magnesium, her postpartum course was not complicated. She was discharged to home on postpartum day 2 doing well.  Results for orders placed during the  hospital encounter of 09/21/12 (from the past 24 hour(s))  CBC     Status: Abnormal   Collection Time    09/23/12  5:45 AM      Result Value Range   WBC 10.4  4.0 - 10.5 K/uL   RBC 3.34 (*) 3.87 - 5.11 MIL/uL   Hemoglobin 10.6 (*) 12.0 - 15.0 g/dL   HCT 13.0 (*) 86.5 - 78.4 %   MCV 91.6  78.0 - 100.0 fL   MCH 31.7  26.0 - 34.0 pg   MCHC 34.6  30.0 - 36.0 g/dL   RDW 69.6 (*) 29.5 - 28.4 %   Platelets 229  150 - 400 K/uL  COMPREHENSIVE METABOLIC PANEL     Status: Abnormal   Collection Time    09/23/12  5:45 AM      Result Value Range   Sodium 137  135 - 145 mEq/L   Potassium 3.2 (*) 3.5 - 5.1 mEq/L   Chloride 102  96 - 112 mEq/L   CO2 27  19 - 32 mEq/L   Glucose, Bld 114 (*) 70 - 99 mg/dL   BUN 7  6 - 23 mg/dL   Creatinine, Ser 1.32  0.50 - 1.10 mg/dL   Calcium 8.7  8.4 - 44.0 mg/dL   Total Protein 5.4 (*) 6.0 -  8.3 g/dL   Albumin 2.1 (*) 3.5 - 5.2 g/dL   AST 63 (*) 0 - 37 U/L   ALT 32  0 - 35 U/L   Alkaline Phosphatase 121 (*) 39 - 117 U/L   Total Bilirubin 0.2 (*) 0.3 - 1.2 mg/dL   GFR calc non Af Amer >90  >90 mL/min   GFR calc Af Amer >90  >90 mL/min   Previous AST was 39 and 45 during hospitalization. Uric acid was 8.8  Feeding:  bottle  Contraception:  Undecided at present.  Discharge hemoglobin:  Hemoglobin  Date Value Range Status  09/23/2012 10.6* 12.0 - 15.0 g/dL Final     HCT  Date Value Range Status  09/23/2012 30.6* 36.0 - 46.0 % Final    Discharge Physical Exam:   General: alert Lochia: appropriate Uterine Fundus: firm Incision: healing well DVT Evaluation: No evidence of DVT seen on physical exam. Negative Homan's sign. Calf/Ankle edema is present, 1+, no clonus  Intrapartum Procedures: spontaneous vaginal delivery and Vaginal birth after cesarean section a, with shoulder dystocia, repair of 2nd degree laceration Postpartum Procedures: Magnesium sulfate x 24 hours, then Procardia 30 mg XL started day 1 Complications-Operative and  Postpartum: 2nd degree perineal laceration  Discharge Diagnoses: Term Pregnancy-delivered and VBAC, shoulder dystocia, pre-eclampsia                                          Anemia  Discharge Information:  Activity:           Per [facility] handout Diet:                routine Medications: Ibuprofen, Percocet and Procardia 30 mg XL daily Condition:      stable Instructions:  refer to practice specific booklet Discharge to: home  Follow-up Information   Follow up with [facility]. Schedule an appointment as soon as possible for a visit in 6 weeks. (Call for questions or concerns.  Take Procardia 30 mg XL daily.. Smart start nurse will check an in home blood pressure 09/27/2012 and call [facility] [place] [facility] with those results)    Contact information:   [address] [place] [address] [phone]       Hal Morales 09/23/2012

## 2012-09-27 IMAGING — US US OB COMP LESS 14 WK
1 series · 14 of 28 positions shown · non-contrast
Comparison: None.

CLINICAL DATA: abd pain,pain; ;

OBSTETRIC <14 WK US AND TRANSVAGINAL OB US
TECHNIQUE: Both transabdominal and transvaginal ultrasound
examinations were performed for complete evaluation of the
gestation as well as the maternal uterus, adnexal regions, and
pelvic cul-de-sac.

[Series 1: us ob comp less 14 wks · 14 of 47 slices shown]
[im 2/47]
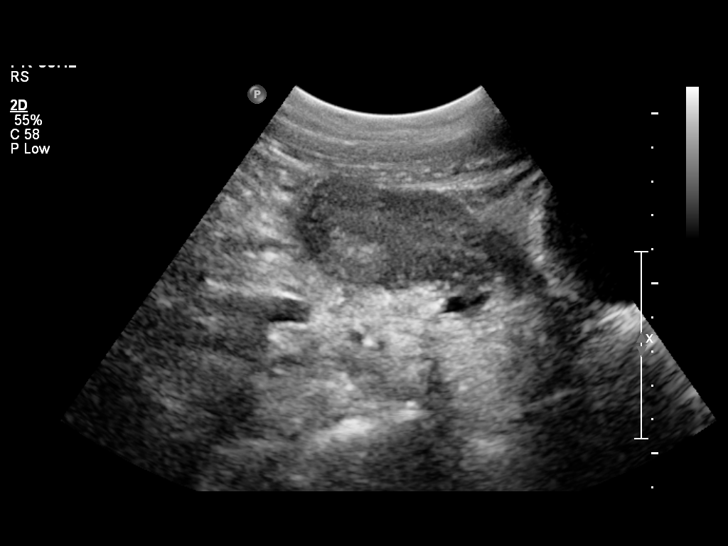
[im 6/47]
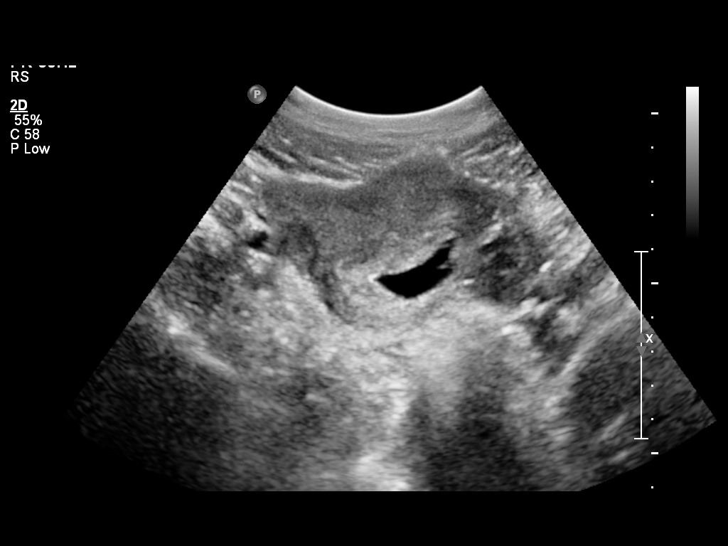
[im 9/47]
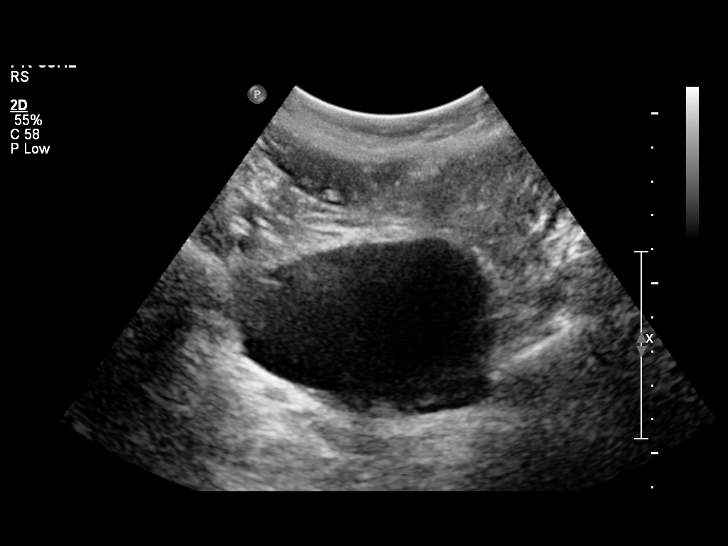
[im 12/47]
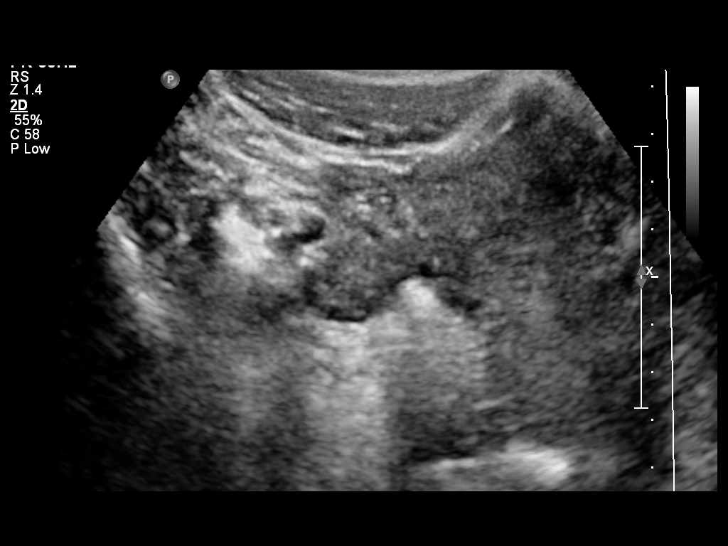
[im 16/47]
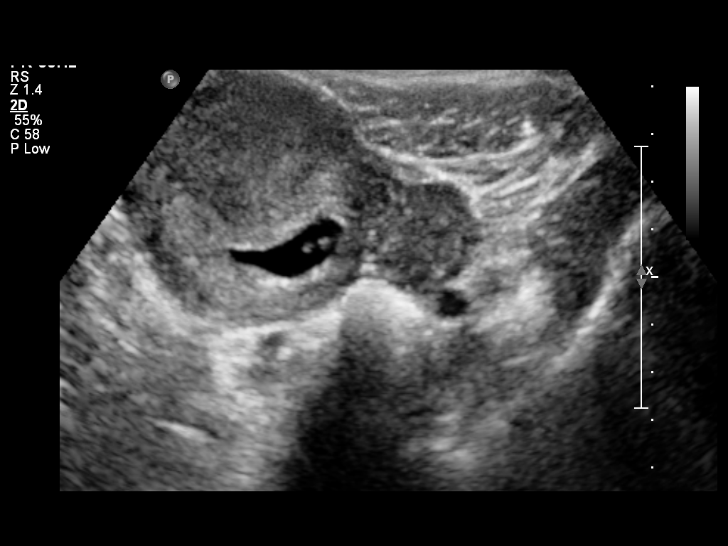
[im 19/47]
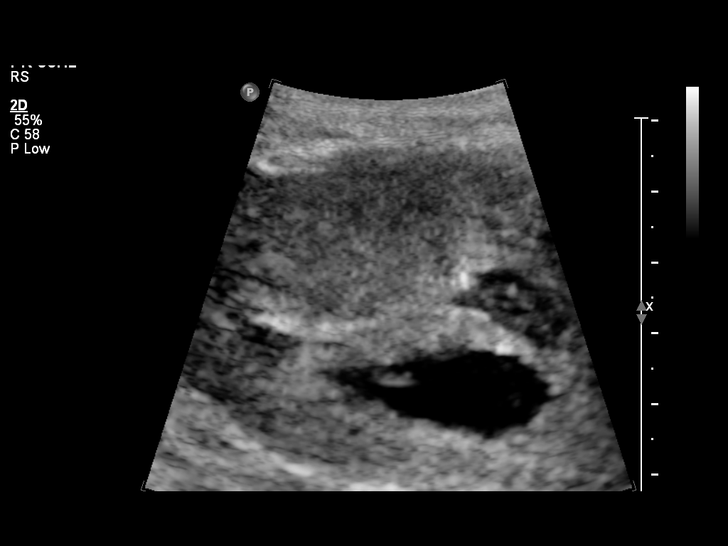
[im 23/47]
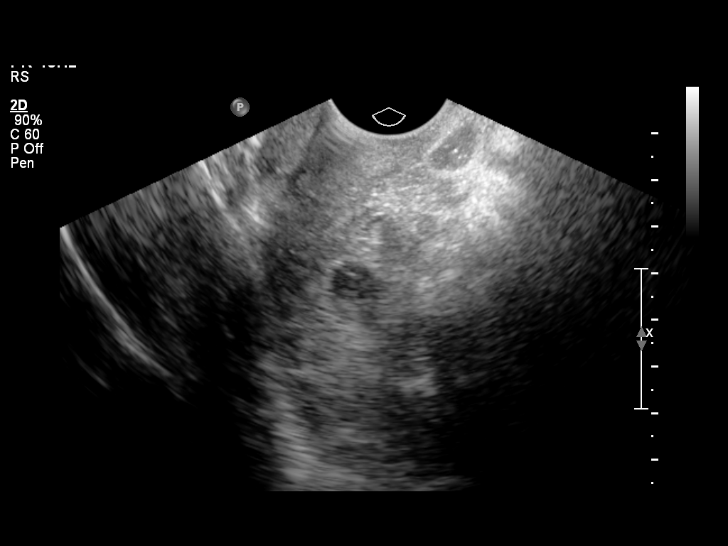
[im 26/47]
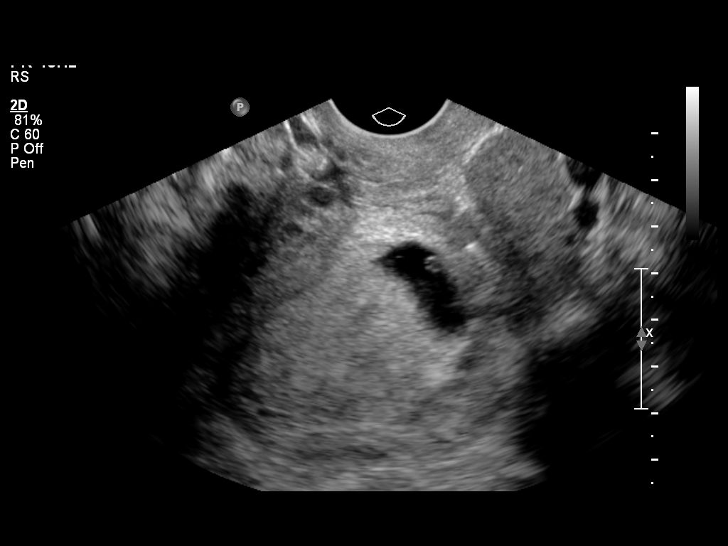
[im 29/47]
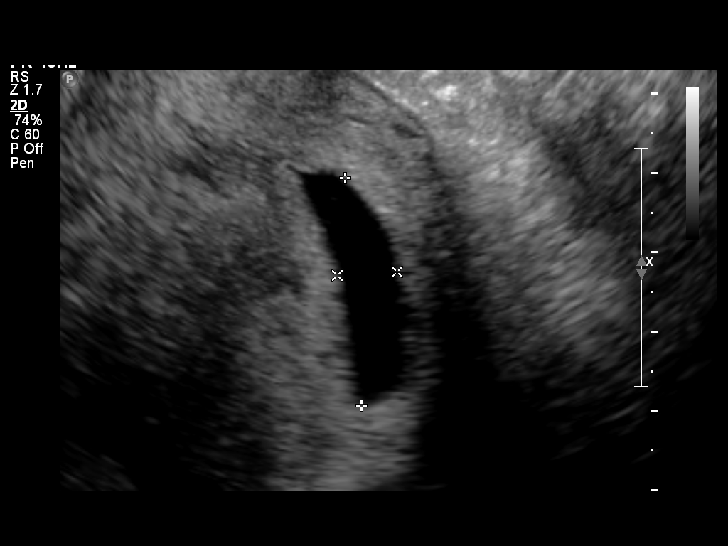
[im 33/47]
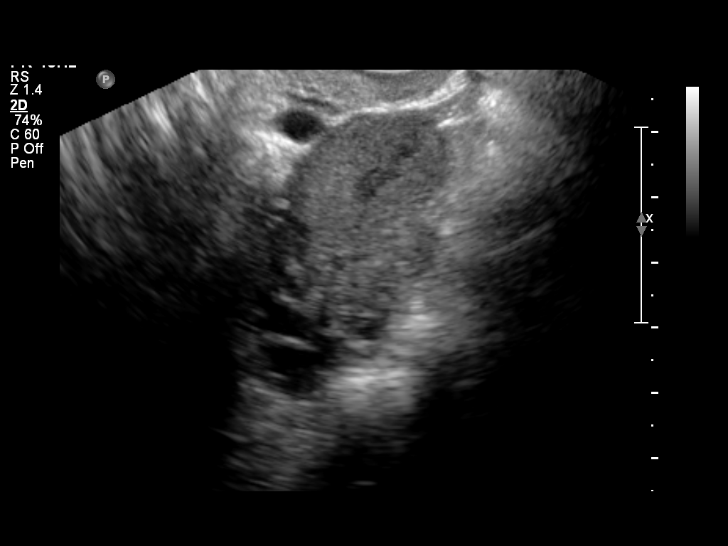
[im 36/47]
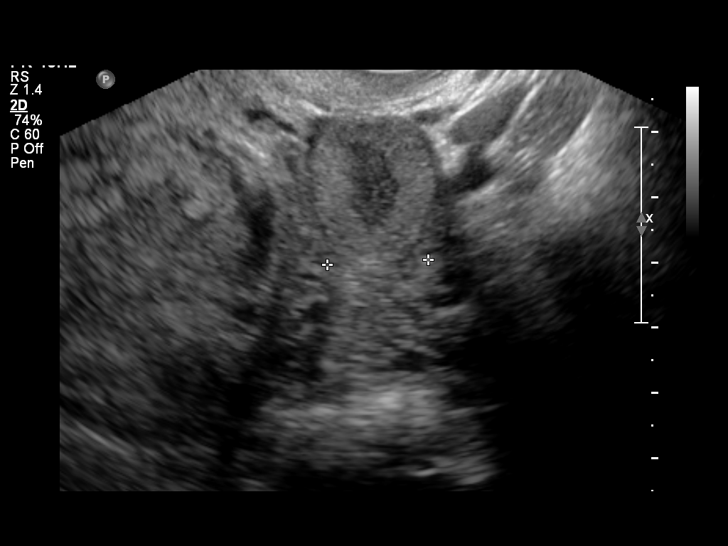
[im 40/47]
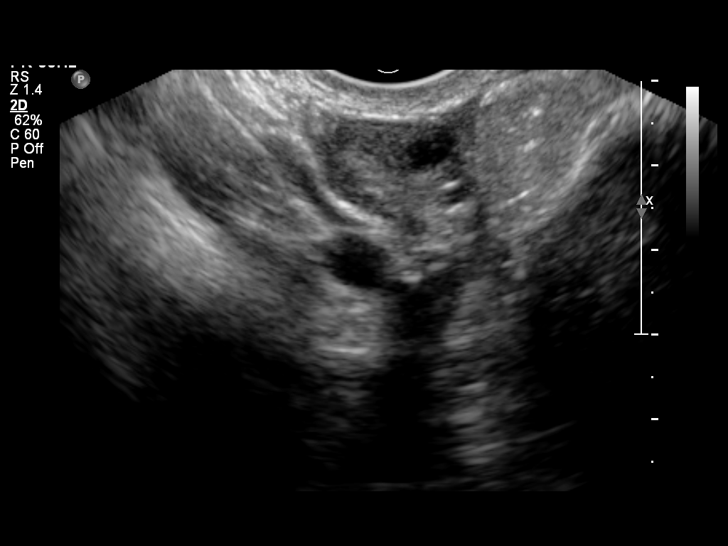
[im 43/47]
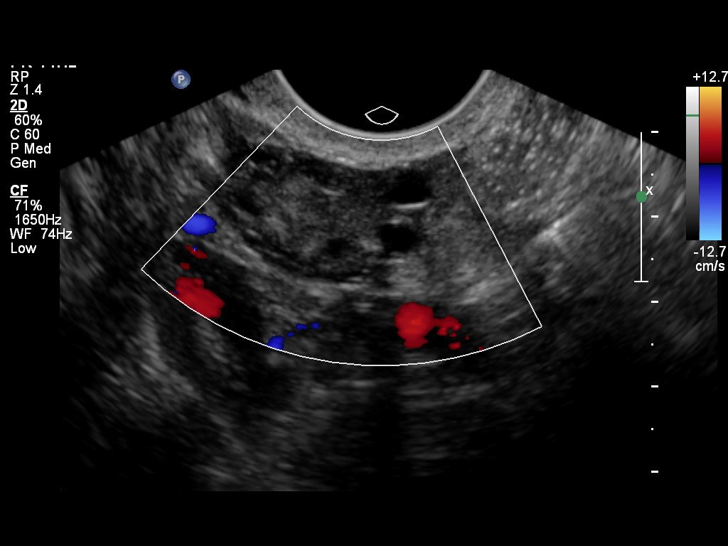
[im 47/47]
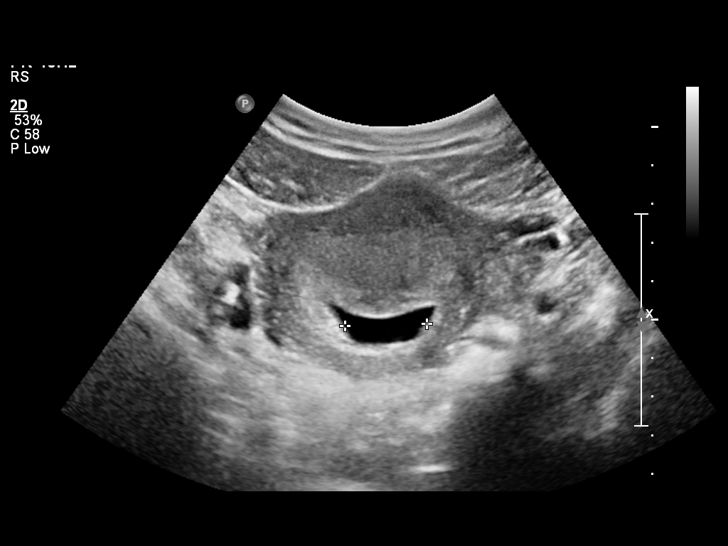

[14 of 28 positions shown; findings below may reference images not displayed]

FINDINGS: There is a single intrauterine gestation.  Based on mean
sac diameter, estimated gestational age is 7 weeks 2 days.  Yolk
sac is present.  Gestational sac is somewhat irregular shaped.  No
definite fetal pole visualized at this time.  Moderate subchorionic
hemorrhage.

Ovaries are symmetric in size and echotexture.  No adnexal masses.
No free fluid.
IMPRESSION: Irregular shaped gestational sac with an estimated gestational age
of 7 weeks 2 days based on mean sac diameter.  No embryo
visualized. The findings meet accepted criteria for anembryonic
(failed) pregnancy.  Because rare exception to these criteria have
been reported, a follow-up ultrasound should be considered to
definitively confirm the accuracy of this diagnosis.

Moderate subchorionic hemorrhage.

## 2014-01-23 ENCOUNTER — Encounter (HOSPITAL_COMMUNITY): Payer: Self-pay | Admitting: *Deleted

## 2015-12-27 ENCOUNTER — Other Ambulatory Visit: Payer: Self-pay | Admitting: Obstetrics and Gynecology

## 2017-05-06 DIAGNOSIS — Z124 Encounter for screening for malignant neoplasm of cervix: Secondary | ICD-10-CM | POA: Diagnosis not present

## 2017-05-06 DIAGNOSIS — Z6825 Body mass index (BMI) 25.0-25.9, adult: Secondary | ICD-10-CM | POA: Diagnosis not present

## 2017-05-06 DIAGNOSIS — Z01419 Encounter for gynecological examination (general) (routine) without abnormal findings: Secondary | ICD-10-CM | POA: Diagnosis not present

## 2018-01-19 ENCOUNTER — Other Ambulatory Visit: Payer: Self-pay | Admitting: Nurse Practitioner

## 2018-01-19 ENCOUNTER — Ambulatory Visit
Admission: RE | Admit: 2018-01-19 | Discharge: 2018-01-19 | Disposition: A | Discharge status: Home / Self Care | Payer: 59 | Source: Ambulatory Visit | Attending: Nurse Practitioner | Admitting: Nurse Practitioner

## 2018-01-19 DIAGNOSIS — R202 Paresthesia of skin: Secondary | ICD-10-CM | POA: Diagnosis not present

## 2018-01-19 DIAGNOSIS — M542 Cervicalgia: Secondary | ICD-10-CM | POA: Diagnosis not present

## 2018-01-19 DIAGNOSIS — E7800 Pure hypercholesterolemia, unspecified: Secondary | ICD-10-CM | POA: Diagnosis not present

## 2019-01-25 IMAGING — CR DG CERVICAL SPINE 2 OR 3 VIEWS
3 series · 3 of 3 positions shown · non-contrast
Comparison: None.

CLINICAL DATA: Neck and bilateral arm pain.

EXAM:
CERVICAL SPINE - 2-3 VIEW

[w c-spine lat]
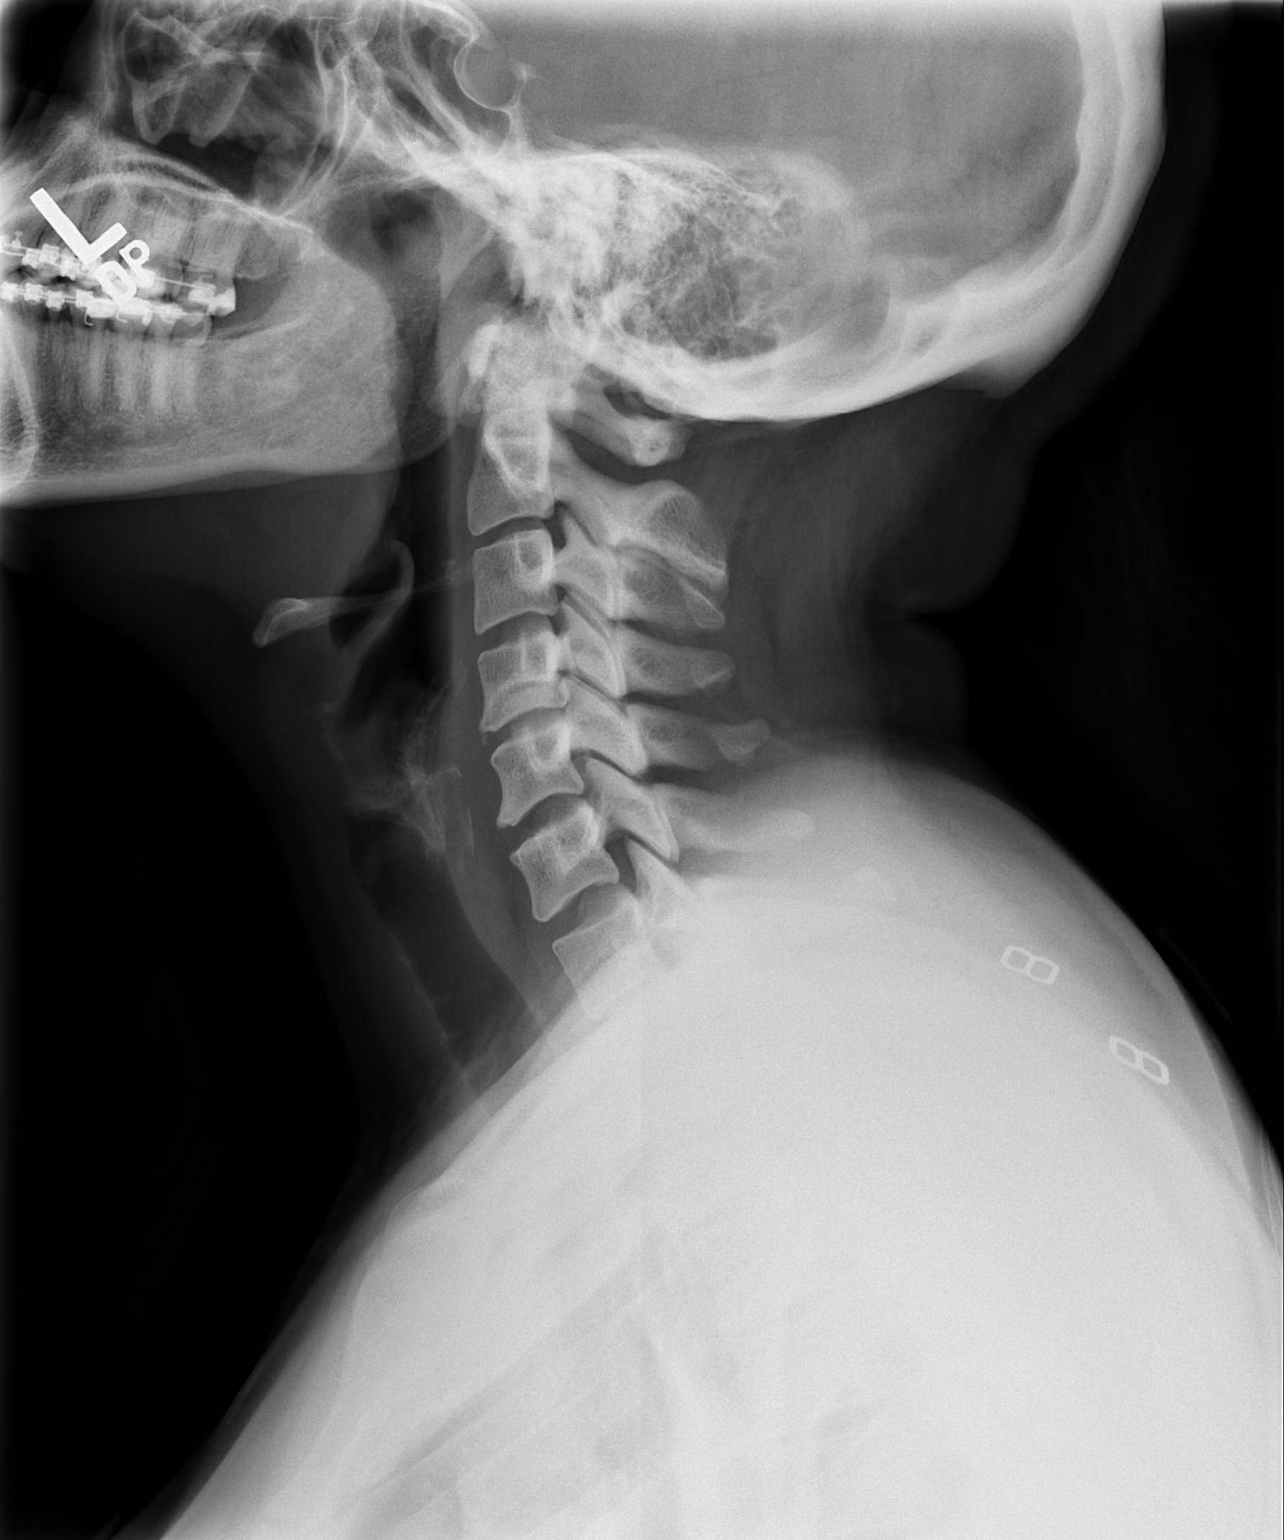

[w c-spine a.p. *]
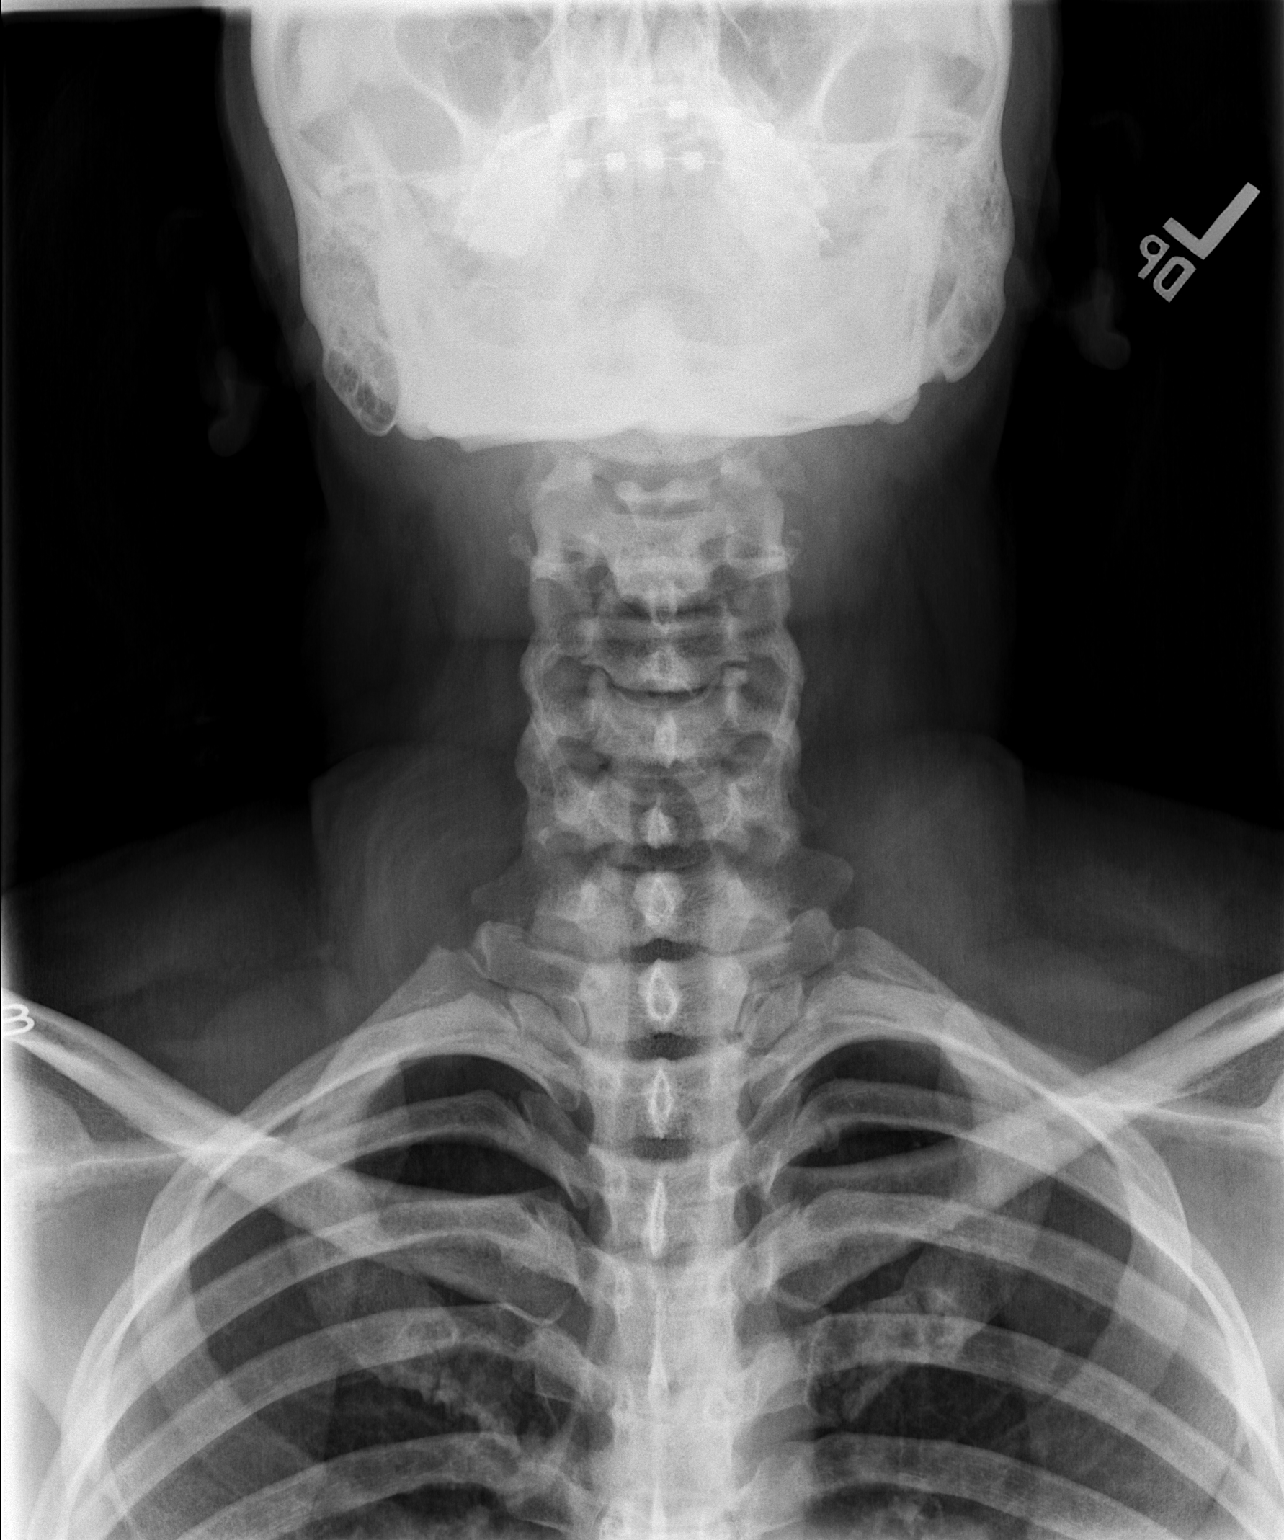

[w c-spine odontoid *]
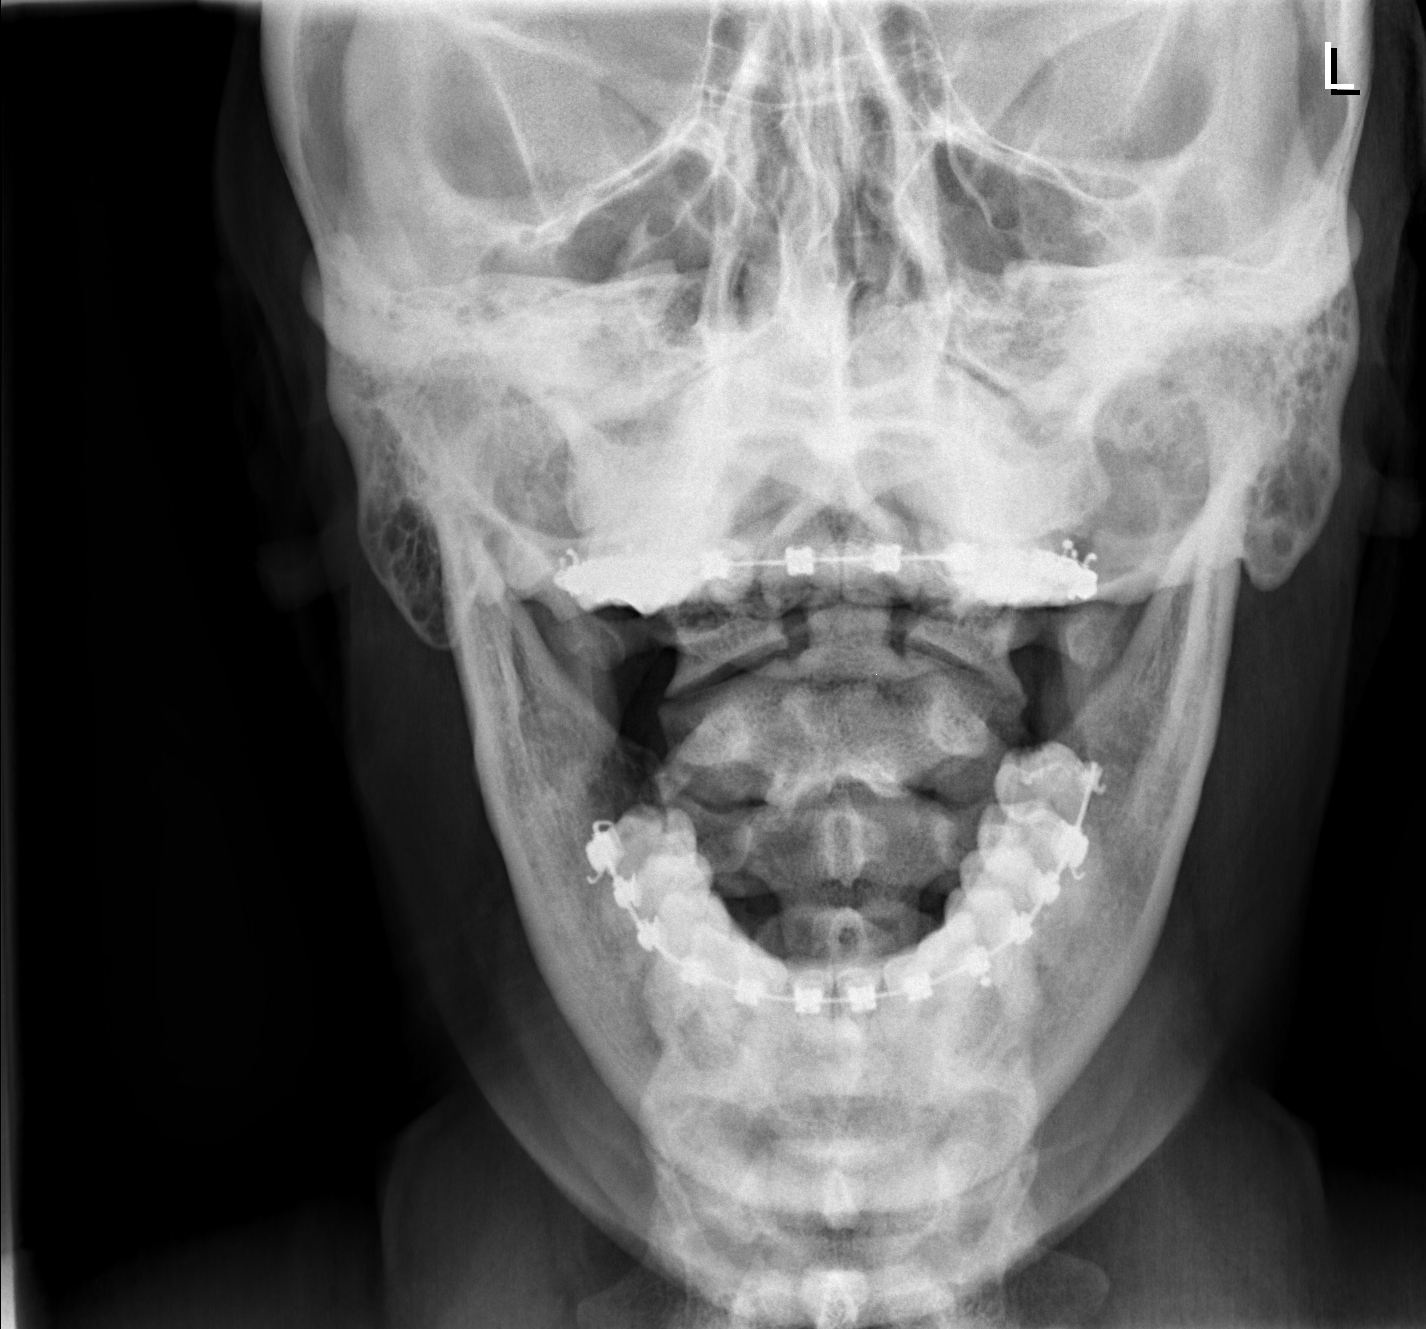

[3 of 3 positions shown; findings below may reference images not displayed]

FINDINGS: There is no evidence of cervical spine fracture or dislocation.
Alignment is normal. Minimal anterior osteophytosis is identified at
C4 and C5.
IMPRESSION: Minimal anterior osteophytosis is identified at C4 and C5.

## 2019-10-05 ENCOUNTER — Other Ambulatory Visit: Payer: Self-pay | Admitting: Obstetrics and Gynecology

## 2019-10-06 ENCOUNTER — Other Ambulatory Visit: Payer: Self-pay

## 2019-10-06 ENCOUNTER — Encounter (HOSPITAL_COMMUNITY): Payer: Self-pay | Admitting: Obstetrics and Gynecology

## 2019-10-06 ENCOUNTER — Other Ambulatory Visit (HOSPITAL_COMMUNITY)
Admission: RE | Admit: 2019-10-06 | Discharge: 2019-10-06 | Disposition: A | Discharge status: Home / Self Care | Payer: 59 | Source: Ambulatory Visit | Attending: Obstetrics and Gynecology | Admitting: Obstetrics and Gynecology

## 2019-10-06 DIAGNOSIS — Z01812 Encounter for preprocedural laboratory examination: Secondary | ICD-10-CM | POA: Diagnosis present

## 2019-10-06 DIAGNOSIS — Z20822 Contact with and (suspected) exposure to covid-19: Secondary | ICD-10-CM | POA: Insufficient documentation

## 2019-10-06 LAB — SARS CORONAVIRUS 2 (TAT 6-24 HRS): SARS Coronavirus 2: NEGATIVE

## 2019-10-06 NOTE — Progress Notes (Signed)
Karen Davidson denies chest pain or shortness of breath. Patient was tested for Covid and has been in quarantine since that time. Karen Davidson has a nose ring that she is not sure she can remove, she will try.

## 2019-10-07 ENCOUNTER — Ambulatory Visit (HOSPITAL_COMMUNITY): Payer: 59 | Admitting: Anesthesiology

## 2019-10-07 ENCOUNTER — Encounter (HOSPITAL_COMMUNITY): Payer: Self-pay | Admitting: Obstetrics and Gynecology

## 2019-10-07 ENCOUNTER — Ambulatory Visit (HOSPITAL_COMMUNITY)
Admission: RE | Admit: 2019-10-07 | Discharge: 2019-10-07 | Disposition: A | Discharge status: Home / Self Care | Payer: 59 | Attending: Obstetrics and Gynecology | Admitting: Obstetrics and Gynecology

## 2019-10-07 ENCOUNTER — Encounter (HOSPITAL_COMMUNITY)
Admission: RE | Disposition: A | Discharge status: Home / Self Care | Payer: Self-pay | Source: Home / Self Care | Attending: Obstetrics and Gynecology

## 2019-10-07 DIAGNOSIS — R109 Unspecified abdominal pain: Secondary | ICD-10-CM | POA: Diagnosis not present

## 2019-10-07 DIAGNOSIS — Z30432 Encounter for removal of intrauterine contraceptive device: Secondary | ICD-10-CM | POA: Diagnosis not present

## 2019-10-07 DIAGNOSIS — T8339XA Other mechanical complication of intrauterine contraceptive device, initial encounter: Secondary | ICD-10-CM

## 2019-10-07 HISTORY — PX: HYSTEROSCOPY WITH D & C: SHX1775

## 2019-10-07 HISTORY — DX: Depression, unspecified: F32.A

## 2019-10-07 LAB — CBC
HCT: 37.7 % (ref 36.0–46.0)
Hemoglobin: 12.2 g/dL (ref 12.0–15.0)
MCH: 31.7 pg (ref 26.0–34.0)
MCHC: 32.4 g/dL (ref 30.0–36.0)
MCV: 97.9 fL (ref 80.0–100.0)
Platelets: 347 10*3/uL (ref 150–400)
RBC: 3.85 MIL/uL — ABNORMAL LOW (ref 3.87–5.11)
RDW: 12.9 % (ref 11.5–15.5)
WBC: 4.5 10*3/uL (ref 4.0–10.5)
nRBC: 0 % (ref 0.0–0.2)

## 2019-10-07 LAB — BASIC METABOLIC PANEL
Anion gap: 10 (ref 5–15)
BUN: 7 mg/dL (ref 6–20)
CO2: 23 mmol/L (ref 22–32)
Calcium: 9.6 mg/dL (ref 8.9–10.3)
Chloride: 106 mmol/L (ref 98–111)
Creatinine, Ser: 0.76 mg/dL (ref 0.44–1.00)
GFR calc Af Amer: >60 mL/min (ref >60)
GFR calc non Af Amer: >60 mL/min (ref >60)
Glucose, Bld: 90 mg/dL (ref 70–99)
Potassium: 3.9 mmol/L (ref 3.5–5.1)
Sodium: 139 mmol/L (ref 135–145)

## 2019-10-07 LAB — POCT PREGNANCY, URINE: Preg Test, Ur: NEGATIVE

## 2019-10-07 SURGERY — DILATATION AND CURETTAGE /HYSTEROSCOPY
Anesthesia: General

## 2019-10-07 MED ORDER — ONDANSETRON HCL 4 MG/2ML IJ SOLN
INTRAMUSCULAR | Status: DC | PRN
  Administered 2019-10-07: 4 mg via INTRAVENOUS

## 2019-10-07 MED ORDER — LACTATED RINGERS IV SOLN: INTRAVENOUS | Status: DC

## 2019-10-07 MED ORDER — PROMETHAZINE HCL 25 MG/ML IJ SOLN: 6.2500 mg | INTRAMUSCULAR | Status: DC | PRN

## 2019-10-07 MED ORDER — DEXAMETHASONE SODIUM PHOSPHATE 10 MG/ML IJ SOLN
INTRAMUSCULAR | Status: DC | PRN
  Administered 2019-10-07: 4 mg via INTRAVENOUS

## 2019-10-07 MED ORDER — KETOROLAC TROMETHAMINE 30 MG/ML IJ SOLN
30.0000 mg | Freq: Once | INTRAMUSCULAR | Status: AC | PRN
  Administered 2019-10-07: 30 mg via INTRAVENOUS

## 2019-10-07 MED ORDER — LIDOCAINE HCL 1 % IJ SOLN
INTRAMUSCULAR | Status: AC
  Filled 2019-10-07: qty 20

## 2019-10-07 MED ORDER — PROPOFOL 10 MG/ML IV BOLUS
INTRAVENOUS | Status: AC
  Filled 2019-10-07: qty 20

## 2019-10-07 MED ORDER — ONDANSETRON HCL 4 MG/2ML IJ SOLN
INTRAMUSCULAR | Status: AC
  Filled 2019-10-07: qty 2

## 2019-10-07 MED ORDER — CHLORHEXIDINE GLUCONATE 0.12 % MT SOLN: 15.0000 mL | Freq: Once | OROMUCOSAL | Status: AC

## 2019-10-07 MED ORDER — OXYCODONE HCL 5 MG/5ML PO SOLN: 5.0000 mg | Freq: Once | ORAL | Status: DC | PRN

## 2019-10-07 MED ORDER — FENTANYL CITRATE (PF) 250 MCG/5ML IJ SOLN
INTRAMUSCULAR | Status: AC
  Filled 2019-10-07: qty 5

## 2019-10-07 MED ORDER — FENTANYL CITRATE (PF) 100 MCG/2ML IJ SOLN
INTRAMUSCULAR | Status: DC | PRN
  Administered 2019-10-07: 25 ug via INTRAVENOUS

## 2019-10-07 MED ORDER — MIDAZOLAM HCL 2 MG/2ML IJ SOLN
INTRAMUSCULAR | Status: AC
  Filled 2019-10-07: qty 2

## 2019-10-07 MED ORDER — SODIUM CHLORIDE 0.9 % IR SOLN
Status: DC | PRN
  Administered 2019-10-07: 3000 mL

## 2019-10-07 MED ORDER — HYDROMORPHONE HCL 1 MG/ML IJ SOLN: 0.2500 mg | INTRAMUSCULAR | Status: DC | PRN

## 2019-10-07 MED ORDER — LIDOCAINE HCL 1 % IJ SOLN
INTRAMUSCULAR | Status: DC | PRN
  Administered 2019-10-07: 10 mL

## 2019-10-07 MED ORDER — DEXAMETHASONE SODIUM PHOSPHATE 10 MG/ML IJ SOLN
INTRAMUSCULAR | Status: AC
  Filled 2019-10-07: qty 1

## 2019-10-07 MED ORDER — IBUPROFEN 600 MG PO TABS: 600.0000 mg | ORAL_TABLET | Freq: Four times a day (QID) | ORAL | 0 refills | Status: AC | PRN

## 2019-10-07 MED ORDER — LIDOCAINE 2% (20 MG/ML) 5 ML SYRINGE
INTRAMUSCULAR | Status: DC | PRN
  Administered 2019-10-07: 30 mg via INTRAVENOUS
  Administered 2019-10-07: 20 mg via INTRAVENOUS

## 2019-10-07 MED ORDER — LIDOCAINE 2% (20 MG/ML) 5 ML SYRINGE
INTRAMUSCULAR | Status: AC
  Filled 2019-10-07: qty 5

## 2019-10-07 MED ORDER — ACETAMINOPHEN 500 MG PO TABS
1000.0000 mg | ORAL_TABLET | Freq: Once | ORAL | Status: AC
  Administered 2019-10-07: 1000 mg via ORAL
  Filled 2019-10-07: qty 2

## 2019-10-07 MED ORDER — KETOROLAC TROMETHAMINE 30 MG/ML IJ SOLN
INTRAMUSCULAR | Status: AC
  Filled 2019-10-07: qty 1

## 2019-10-07 MED ORDER — CHLORHEXIDINE GLUCONATE 0.12 % MT SOLN
OROMUCOSAL | Status: AC
  Administered 2019-10-07: 15 mL via OROMUCOSAL
  Filled 2019-10-07: qty 15

## 2019-10-07 MED ORDER — PROPOFOL 10 MG/ML IV BOLUS
INTRAVENOUS | Status: DC | PRN
  Administered 2019-10-07: 130 mg via INTRAVENOUS
  Administered 2019-10-07: 20 mg via INTRAVENOUS

## 2019-10-07 MED ORDER — ORAL CARE MOUTH RINSE: 15.0000 mL | Freq: Once | OROMUCOSAL | Status: AC

## 2019-10-07 MED ORDER — OXYCODONE HCL 5 MG PO TABS: 5.0000 mg | ORAL_TABLET | Freq: Once | ORAL | Status: DC | PRN

## 2019-10-07 MED ORDER — PROPOFOL 1000 MG/100ML IV EMUL
INTRAVENOUS | Status: AC
  Filled 2019-10-07: qty 100

## 2019-10-07 MED ORDER — CEFAZOLIN SODIUM-DEXTROSE 2-4 GM/100ML-% IV SOLN
2.0000 g | INTRAVENOUS | Status: AC
  Administered 2019-10-07: 2 g via INTRAVENOUS
  Filled 2019-10-07: qty 100

## 2019-10-07 MED ORDER — POVIDONE-IODINE 10 % EX SWAB: 2.0000 "application " | Freq: Once | CUTANEOUS | Status: DC

## 2019-10-07 MED ORDER — MIDAZOLAM HCL 5 MG/5ML IJ SOLN
INTRAMUSCULAR | Status: DC | PRN
  Administered 2019-10-07: 2 mg via INTRAVENOUS

## 2019-10-07 MED ORDER — OXYCODONE-ACETAMINOPHEN 5-325 MG PO TABS: 1.0000 | ORAL_TABLET | Freq: Four times a day (QID) | ORAL | 0 refills | Status: AC | PRN

## 2019-10-07 SURGICAL SUPPLY — 13 items
CATH ROBINSON RED A/P 16FR (CATHETERS) ×2 IMPLANT
GLOVE BIO SURGEON STRL SZ7.5 (GLOVE) ×2 IMPLANT
GLOVE BIOGEL PI IND STRL 7.0 (GLOVE) ×1 IMPLANT
GLOVE BIOGEL PI IND STRL 7.5 (GLOVE) ×1 IMPLANT
GLOVE BIOGEL PI INDICATOR 7.0 (GLOVE) ×1
GLOVE BIOGEL PI INDICATOR 7.5 (GLOVE) ×1
GOWN STRL REUS W/ TWL LRG LVL3 (GOWN DISPOSABLE) ×2 IMPLANT
GOWN STRL REUS W/TWL LRG LVL3 (GOWN DISPOSABLE) ×4
KIT PROCEDURE FLUENT (KITS) ×2 IMPLANT
PACK VAGINAL MINOR WOMEN LF (CUSTOM PROCEDURE TRAY) ×2 IMPLANT
PAD OB MATERNITY 4.3X12.25 (PERSONAL CARE ITEMS) ×2 IMPLANT
SEAL ROD LENS SCOPE MYOSURE (ABLATOR) ×1 IMPLANT
TOWEL GREEN STERILE FF (TOWEL DISPOSABLE) ×4 IMPLANT

## 2019-10-07 NOTE — Transfer of Care (Signed)
Immediate Anesthesia Transfer of Care Note  Patient: Karen Davidson  Procedure(s) Performed: DILATATION AND CURETTAGE /HYSTEROSCOPY (N/A )  Patient Location: PACU  Anesthesia Type:General  Level of Consciousness: drowsy  Airway & Oxygen Therapy: Patient Spontanous Breathing  Post-op Assessment: Report given to RN and Post -op Vital signs reviewed and stable  Post vital signs: Reviewed and stable  Last Vitals:  Vitals Value Taken Time  BP    Temp    Pulse    Resp    SpO2 91     Last Pain:  Vitals:   10/07/19 1235  TempSrc:   PainSc: 0-No pain      Patients Stated Pain Goal: 3 (10/07/19 1235)  Complications: No complications documented.

## 2019-10-07 NOTE — Anesthesia Procedure Notes (Signed)
Procedure Name: LMA Insertion °Performed by: Kassadie Pancake H, CRNA °Pre-anesthesia Checklist: Patient identified, Emergency Drugs available, Suction available and Patient being monitored °Patient Re-evaluated:Patient Re-evaluated prior to induction °Oxygen Delivery Method: Circle System Utilized °Preoxygenation: Pre-oxygenation with 100% oxygen °Induction Type: IV induction °Ventilation: Mask ventilation without difficulty °LMA: LMA inserted °LMA Size: 4.0 °Number of attempts: 1 °Airway Equipment and Method: Bite block °Placement Confirmation: positive ETCO2 °Tube secured with: Tape °Dental Injury: Teeth and Oropharynx as per pre-operative assessment  ° ° ° ° ° ° °

## 2019-10-07 NOTE — Op Note (Signed)
Preop Diagnosis: 1.Pain 2.Retained IUD   Postop Diagnosis: 1.Pain 2.Retained IUD   Procedure: 1.DILATATION AND CURETTAGE /HYSTEROSCOPY 2.REMOVAL OF IUD  Anesthesia: Choice   Anesthesiologist: Jairo Ben, MD   Attending: Osborn Coho, MD   Assistant: N/a  Findings: IUD transverse in LUS partially in cervix  Pathology: N/a   Fluids: 800cc Fluid deficit 100 cc  UOP: 30cc via straight cath prior to procedure.  Pt voided prior to procedure.  EBL: 5cc  Complications: None  Procedure:The patient was taken to the operating room after the risks, benefits and alternatives were discussed with the patient. The patient verbalized understanding and consent signed and witnessed. The patient was placed under general anesthesia with an LMA per anesthesiologist and prepped and draped in the normal sterile fashion.  Time Out was performed per protocol.  A bivalve speculum was placed in the patient's vagina and the anterior lip of the cervix was grasped with a single tooth tenaculum. A paracervical block was administered using a total of 10 cc of 1% lidocaine. The uterus sounded to 8 cm. The cervix was dilated for passage of the hysteroscope.  The hysteroscope was introduced into the uterine cavity and findings as noted above. IUD was grasped with a large kelly and removed. Sharp curettage was performed until a gritty texture was noted and curettings were sent to pathology and IUD sent for culture.  The hysteroscope was reintroduced and no obvious remaining intracavitary lesions were noted but ostia was difficult to visualize and could not be seen due to proliferative endometrium.  All instruments were removed. Sponge lap and needle count was correct. The patient tolerated the procedure well and was returned to the recovery room in good condition.

## 2019-10-07 NOTE — H&P (Signed)
Karen Davidson is an 39 y.o. female. Pt presented to office on Tuesday requesting IUD removal d/t pain and wanted to switch BC method.  IUD was unable to be removed and ultrasound showed it to be transverse in the uterus and possibly embedded.  Options reviewed.  Pt wanted to proceed with hysteroscopic removal as soon as possible.   Menstrual History:  Patient's last menstrual period was 09/16/2019.    Past Medical History:  Diagnosis Date  . Abnormal Pap smear 2012   LEEP; LAST PAP 2013  . Anxiety 2009   SHORT TERM MEDS  . Cervical dysplasia    Normal pap 05/2009,03/2010  . Depression   . UJWJXBJY(782.9)    FREQUENT;  3X/MONTH- 10/06/2019- not any longer  . HSV-2 infection   . Infection    FREQUENT UTI DURING PREGNANCY  . Infection 2004   HSV2  . Laceration of arm 2004   SURGICAL REPAIR  . Preterm labor 2007    Past Surgical History:  Procedure Laterality Date  . ARTERY REPAIR  2004   TENDON/ARTERY REPAIR RIGHT ARM  . CERVICAL BIOPSY  W/ LOOP ELECTRODE EXCISION  2010   CIN II with positive endocervical margins  . CESAREAN SECTION  02/2006  . TENDON REPAIR    . TONSILLECTOMY  AGE 54  . WISDOM TOOTH EXTRACTION  2011    Family History  Problem Relation Age of Onset  . Asthma Mother   . Liver disease Mother        HEPATITIS C  . Mental illness Mother        BIPOLAR; SCHIZOPHRENIA  . Diabetes Mother   . Alcohol abuse Mother   . Drug abuse Mother   . Early death Mother 59       CAR ACCIDENT  . Drug abuse Father   . Alcohol abuse Father   . Asthma Brother   . Asthma Brother   . Mental illness Brother        BIPOLAR  . Asthma Son   . Heart disease Son        MURMUR  . Mental illness Maternal Aunt        BIPOLAR  . Diabetes Maternal Aunt   . Arthritis Maternal Grandmother   . Diabetes Maternal Grandmother   . Hyperlipidemia Maternal Grandmother   . Hypertension Maternal Grandmother   . Kidney disease Maternal Grandmother   . Other Maternal Grandmother         VARICOSE VEINS  . Mental illness Maternal Grandfather        BIPOLAR  . Cancer Paternal Grandfather        PROSTATE    Social History:  reports that she has never smoked. She has never used smokeless tobacco. She reports current alcohol use. She reports that she does not use drugs.  Allergies: No Known Allergies  Medications Prior to Admission  Medication Sig Dispense Refill Last Dose  . Multiple Vitamins-Minerals (WOMENS MULTI GUMMIES PO) Take 2 tablets by mouth daily.   10/06/2019 at Unknown time  . valACYclovir (VALTREX) 500 MG tablet Take 500 mg by mouth daily.   10/06/2019 at Unknown time    Review of Systems No pain today, heavy last cycle ended on 09-27-19, no fever, chills, nausea, vomiting or diarrhea.  Blood pressure 113/76, pulse 68, temperature 98.7 F (37.1 C), temperature source Oral, resp. rate 18, height 5' 4.5" (1.638 m), weight 61.7 kg, last menstrual period 09/16/2019, SpO2 100 %, unknown if currently breastfeeding. Physical Exam Lungs  nl appearing respirations CV RRR Abd soft NT Ext no calf tenderness  Results for orders placed or performed during the hospital encounter of 10/07/19 (from the past 24 hour(s))  Basic metabolic panel     Status: None   Collection Time: 10/07/19 12:30 PM  Result Value Ref Range   Sodium 139 135 - 145 mmol/L   Potassium 3.9 3.5 - 5.1 mmol/L   Chloride 106 98 - 111 mmol/L   CO2 23 22 - 32 mmol/L   Glucose, Bld 90 70 - 99 mg/dL   BUN 7 6 - 20 mg/dL   Creatinine, Ser 6.28 0.44 - 1.00 mg/dL   Calcium 9.6 8.9 - 31.5 mg/dL   GFR calc non Af Amer >60 >60 mL/min   GFR calc Af Amer >60 >60 mL/min   Anion gap 10 5 - 15  CBC     Status: Abnormal   Collection Time: 10/07/19 12:30 PM  Result Value Ref Range   WBC 4.5 4.0 - 10.5 K/uL   RBC 3.85 (L) 3.87 - 5.11 MIL/uL   Hemoglobin 12.2 12.0 - 15.0 g/dL   HCT 17.6 36 - 46 %   MCV 97.9 80.0 - 100.0 fL   MCH 31.7 26.0 - 34.0 pg   MCHC 32.4 30.0 - 36.0 g/dL   RDW 16.0 73.7 - 10.6 %    Platelets 347 150 - 400 K/uL   nRBC 0.0 0.0 - 0.2 %  Pregnancy, urine POC     Status: None   Collection Time: 10/07/19 12:47 PM  Result Value Ref Range   Preg Test, Ur NEGATIVE NEGATIVE    No results found.  Assessment/Plan: P2 with malpositioned IUD unable to be removed in the office here today for hysteroscopic removal and D&C.  Risks benefits alternatives discussed with the patient including but not limited to bleeding infection and injury.  Questions answered and consent signed and witnessed.  Pt wants to start OCPs which have been sent to her pharmacy after discussion of SEs rba.  Purcell Nails 10/07/2019, 1:54 PM

## 2019-10-07 NOTE — Anesthesia Postprocedure Evaluation (Signed)
Anesthesia Post Note  Patient: Karen Davidson  Procedure(s) Performed: DILATATION AND CURETTAGE /HYSTEROSCOPY (N/A )     Patient location during evaluation: PACU Anesthesia Type: General Level of consciousness: oriented, awake and patient cooperative Pain management: pain level controlled Vital Signs Assessment: post-procedure vital signs reviewed and stable Respiratory status: spontaneous breathing, nonlabored ventilation and respiratory function stable Cardiovascular status: blood pressure returned to baseline and stable Postop Assessment: no apparent nausea or vomiting Anesthetic complications: no   No complications documented.  Last Vitals:  Vitals:   10/07/19 1605 10/07/19 1620  BP: 113/77 116/71  Pulse: 71 64  Resp: (!) 22 (!) 21  Temp:    SpO2: 95% 100%    Last Pain:  Vitals:   10/07/19 1620  TempSrc:   PainSc: 0-No pain                 Tanina Barb,E. Nga Rabon

## 2019-10-07 NOTE — Anesthesia Preprocedure Evaluation (Addendum)
Anesthesia Evaluation  Patient identified by MRN, date of birth, ID band Patient awake    Reviewed: Allergy & Precautions, H&P , NPO status , Patient's Chart, lab work & pertinent test results  Airway Mallampati: II  TM Distance: >3 FB Neck ROM: Full    Dental no notable dental hx. (+) Teeth Intact, Dental Advisory Given Braces upper and lower :   Pulmonary neg pulmonary ROS,    Pulmonary exam normal breath sounds clear to auscultation       Cardiovascular (-) hypertensionNormal cardiovascular exam Rhythm:Regular Rate:Normal     Neuro/Psych  Headaches, PSYCHIATRIC DISORDERS Anxiety Depression    GI/Hepatic negative GI ROS, Neg liver ROS,   Endo/Other  negative endocrine ROS  Renal/GU negative Renal ROS  negative genitourinary   Musculoskeletal negative musculoskeletal ROS (+)   Abdominal Normal abdominal exam  (+)   Peds negative pediatric ROS (+)  Hematology negative hematology ROS (+)   Anesthesia Other Findings Retained IUD  Reproductive/Obstetrics negative OB ROS                            Anesthesia Physical Anesthesia Plan  ASA: I  Anesthesia Plan: General   Post-op Pain Management:    Induction: Intravenous  PONV Risk Score and Plan: 4 or greater and Ondansetron, Dexamethasone, Midazolam and Treatment may vary due to age or medical condition  Airway Management Planned: LMA  Additional Equipment: None  Intra-op Plan:   Post-operative Plan: Extubation in OR  Informed Consent: I have reviewed the patients History and Physical, chart, labs and discussed the procedure including the risks, benefits and alternatives for the proposed anesthesia with the patient or authorized representative who has indicated his/her understanding and acceptance.     Dental advisory given  Plan Discussed with: CRNA  Anesthesia Plan Comments:        Anesthesia Quick Evaluation

## 2019-10-08 ENCOUNTER — Encounter (HOSPITAL_COMMUNITY): Payer: Self-pay | Admitting: Obstetrics and Gynecology

## 2019-10-10 LAB — SURGICAL PATHOLOGY

## 2019-10-12 LAB — AEROBIC/ANAEROBIC CULTURE W GRAM STAIN (SURGICAL/DEEP WOUND)
Culture: NO GROWTH
Gram Stain: NONE SEEN
